# Patient Record
Sex: Female | Born: 1995 | Hispanic: Yes | Marital: Single | Smoking: Never smoker
Health system: Southern US, Community
[De-identification: ages and names within clinical notes are randomized; demographics above are authoritative.]

## PROBLEM LIST (undated history)

## (undated) ENCOUNTER — Inpatient Hospital Stay (HOSPITAL_COMMUNITY): Payer: Self-pay

## (undated) DIAGNOSIS — Z8489 Family history of other specified conditions: Secondary | ICD-10-CM

## (undated) DIAGNOSIS — E039 Hypothyroidism, unspecified: Secondary | ICD-10-CM

## (undated) HISTORY — DX: Hypothyroidism, unspecified: E03.9

## (undated) HISTORY — PX: NO PAST SURGERIES: SHX2092

---

## 2015-11-25 ENCOUNTER — Other Ambulatory Visit: Payer: Self-pay

## 2015-11-25 DIAGNOSIS — Z3401 Encounter for supervision of normal first pregnancy, first trimester: Secondary | ICD-10-CM

## 2015-11-25 LAB — HIV ANTIBODY (ROUTINE TESTING W REFLEX): HIV: NONREACTIVE

## 2015-11-26 LAB — OBSTETRIC PANEL
Antibody Screen: NEGATIVE
BASOS PCT: 0 %
Basophils Absolute: 0 cells/uL (ref 0–200)
EOS PCT: 0 %
Eosinophils Absolute: 0 cells/uL — ABNORMAL LOW (ref 15–500)
HEMATOCRIT: 37.8 % (ref 35.0–45.0)
HEP B S AG: NEGATIVE
Hemoglobin: 13.2 g/dL (ref 11.7–15.5)
LYMPHS ABS: 1728 {cells}/uL (ref 850–3900)
Lymphocytes Relative: 24 %
MCH: 30.8 pg (ref 27.0–33.0)
MCHC: 34.9 g/dL (ref 32.0–36.0)
MCV: 88.3 fL (ref 80.0–100.0)
MPV: 10.2 fL (ref 7.5–12.5)
Monocytes Absolute: 432 cells/uL (ref 200–950)
Monocytes Relative: 6 %
NEUTROS ABS: 5040 {cells}/uL (ref 1500–7800)
NEUTROS PCT: 70 %
Platelets: 244 10*3/uL (ref 140–400)
RBC: 4.28 MIL/uL (ref 3.80–5.10)
RDW: 13.8 % (ref 11.0–15.0)
RH TYPE: POSITIVE
Rubella: <0.9 Index (ref <0.90)
WBC: 7.2 10*3/uL (ref 3.8–10.8)

## 2015-11-26 LAB — CULTURE, OB URINE
Colony Count: NO GROWTH
ORGANISM ID, BACTERIA: NO GROWTH

## 2015-11-26 LAB — SICKLE CELL SCREEN: Sickle Cell Screen: NEGATIVE

## 2015-12-02 ENCOUNTER — Encounter: Payer: Self-pay | Admitting: Internal Medicine

## 2015-12-02 ENCOUNTER — Ambulatory Visit (INDEPENDENT_AMBULATORY_CARE_PROVIDER_SITE_OTHER): Payer: Self-pay | Admitting: Internal Medicine

## 2015-12-02 VITALS — BP 80/50 | HR 101 | Temp 98.9°F | Ht 64.75 in | Wt 131.0 lb

## 2015-12-02 DIAGNOSIS — R112 Nausea with vomiting, unspecified: Secondary | ICD-10-CM

## 2015-12-02 DIAGNOSIS — N898 Other specified noninflammatory disorders of vagina: Secondary | ICD-10-CM

## 2015-12-02 DIAGNOSIS — O0000 Abdominal pregnancy without intrauterine pregnancy: Secondary | ICD-10-CM

## 2015-12-02 LAB — POCT WET PREP (WET MOUNT): Clue Cells Wet Prep Whiff POC: NEGATIVE

## 2015-12-02 LAB — POCT URINALYSIS DIPSTICK
Blood, UA: NEGATIVE
Glucose, UA: NEGATIVE
Ketones, UA: >=160
NITRITE UA: NEGATIVE
PH UA: 5.5
Spec Grav, UA: >=1.03
Urobilinogen, UA: 1

## 2015-12-02 LAB — POCT UA - MICROSCOPIC ONLY

## 2015-12-02 LAB — OB RESULTS CONSOLE GC/CHLAMYDIA: GC PROBE AMP, GENITAL: NEGATIVE

## 2015-12-02 NOTE — Progress Notes (Signed)
[name] is a 20 y.o. yo G2P0010 at 4946w6d who presents for her initial prenatal visit. Pregnancy is not planned She reports nausea. Has vomited about 3x this month. Patient would like medication for this.  She  is taking PNV. See flow sheet for details.  PMH, POBH, FH, meds, allergies and Social Hx reviewed.  Prenatal Exam: Gen: Well nourished, well developed.  No distress.  Vitals noted. HEENT: Normocephalic, atraumatic.  Neck supple without cervical lymphadenopathy, thyromegaly or thyroid nodules.  Fair dentition. CV: RRR no murmur, gallops or rubs Lungs: CTAB.  Normal respiratory effort without wheezes or rales. Abd: soft, NTND. +BS.  Uterus not appreciated above pelvis. GU: Normal external female genitalia without lesions.  Normal vaginal, well rugated without lesions. Green/white vaginal discharge.  Bimanual exam: No adnexal mass, patient with cervical motion tenderness on exam.  Uterus size not determined Ext: No clubbing, cyanosis or edema. Psych: Normal grooming and dress.  Not depressed or anxious appearing.  Normal thought content and process without flight of ideas or looseness of associations.  Assessment & Plan: 1) 20 y.o. yo G1P0 at G2P0010 at 1146w6d doing well.  Current pregnancy issues include patient with vaginal discharge and dyspareunia. Dating is not reliable. Prenatal labs reviewed, notable G/C was not collected. Will collect at this visit. Genetic screening offered: - patient would like genetic screening  Early glucola is not indicated.  PHQ-9 and Pregnancy Medical Home forms completed and reviewed. PHQ-9 with a score of 12, discussed patient talking to behavioral medicine. Patient she might be interested in talking in the future but not today.  Nausea, prescribed pyridoxine  Yeast infection noted from lab result, prescribed miconazole cream   Bleeding and pain precautions reviewed. Importance of prenatal vitamins reviewed.  Follow up in 4 weeks.

## 2015-12-02 NOTE — Patient Instructions (Addendum)
I will call you regarding results as discussed. Please follow up in 4 weeks for next OB visit. You can take pyridoxine every 8 hours for nausea.

## 2015-12-03 ENCOUNTER — Telehealth: Payer: Self-pay | Admitting: Internal Medicine

## 2015-12-03 ENCOUNTER — Encounter: Payer: Self-pay | Admitting: Internal Medicine

## 2015-12-03 DIAGNOSIS — R112 Nausea with vomiting, unspecified: Secondary | ICD-10-CM

## 2015-12-03 HISTORY — DX: Nausea with vomiting, unspecified: R11.2

## 2015-12-03 LAB — GC/CHLAMYDIA PROBE AMP (~~LOC~~) NOT AT ARMC
CHLAMYDIA, DNA PROBE: NEGATIVE
NEISSERIA GONORRHEA: NEGATIVE

## 2015-12-03 LAB — CULTURE, OB URINE
Colony Count: NO GROWTH
Organism ID, Bacteria: NO GROWTH

## 2015-12-03 MED ORDER — VITAMIN B-6 25 MG PO TABS: 25.0000 mg | ORAL_TABLET | Freq: Three times a day (TID) | ORAL | Status: DC | PRN

## 2015-12-03 MED ORDER — VITAMIN B-6 25 MG PO TABS: 25.0000 mg | ORAL_TABLET | Freq: Every day | ORAL | Status: DC

## 2015-12-03 MED ORDER — MICONAZOLE NITRATE 4 % VA CREA: TOPICAL_CREAM | VAGINAL | Status: DC

## 2015-12-04 ENCOUNTER — Telehealth: Payer: Self-pay | Admitting: *Deleted

## 2015-12-04 NOTE — Telephone Encounter (Signed)
-----   Message from Kehinde T Eniola, MD sent at 12/04/2015  1:32 PM EDT ----- Please help schedule nurse visit for patient for BP check and to assess her vomiting status. Thank you. 

## 2015-12-04 NOTE — Telephone Encounter (Signed)
[facility]'t [facility] MICONAZOLE NITRATE VAGINAL 4 % CREAM. They only have 2%.  Order given to Kathlene NovemberMike, Pharmacist to change.  Clovis PuMartin, Tamika L, RN

## 2015-12-04 NOTE — Telephone Encounter (Signed)
Left message for pt to call me back to schedule a nurse visit. Page, cma.

## 2015-12-05 ENCOUNTER — Telehealth: Payer: Self-pay | Admitting: *Deleted

## 2015-12-05 NOTE — Telephone Encounter (Signed)
Called patient to set up blood pressure check, no answer as well.

## 2015-12-05 NOTE — Telephone Encounter (Signed)
[place] interpreter Marylu LundJanet [phone].  Patient is aware and scheduled for 12-06-15 and reconfirmed ultrasound for 12-09-15. Nevae Pinnix,CMA

## 2015-12-05 NOTE — Telephone Encounter (Signed)
-----   Message from Doreene ElandKehinde T Eniola, MD sent at 12/04/2015  1:32 PM EDT ----- Please help schedule nurse visit for patient for BP check and to assess her vomiting status. Thank you.

## 2015-12-06 ENCOUNTER — Encounter: Payer: Self-pay | Admitting: *Deleted

## 2015-12-06 ENCOUNTER — Ambulatory Visit (INDEPENDENT_AMBULATORY_CARE_PROVIDER_SITE_OTHER): Payer: Self-pay | Admitting: *Deleted

## 2015-12-06 VITALS — BP 90/52 | HR 96

## 2015-12-06 DIAGNOSIS — R031 Nonspecific low blood-pressure reading: Secondary | ICD-10-CM

## 2015-12-06 DIAGNOSIS — Z136 Encounter for screening for cardiovascular disorders: Secondary | ICD-10-CM

## 2015-12-06 DIAGNOSIS — Z0130 Encounter for examination of blood pressure without abnormal findings: Secondary | ICD-10-CM

## 2015-12-06 NOTE — Progress Notes (Signed)
   Patient in nurse clinic for blood pressure check.  Patient is about 14 weeks pregnancy with her first child.  Patient stated she is having some dizziness and nausea.  Patient denies any vomiting.  Patient also denies any chest pain, abdominal/pelvic cramping or vaginal bleeding.  Precept with Dr. Jennette KettleNeal; patient to drink about 1 gallon of water a day for the next 3 days and return on Monday 12/09/15 for blood pressure check with nurse.  Patient also advised to go to MAU if dizziness worsen, vomiting, abdominal/pelvic pain or vaginal bleeding.  Work note given to be absent for Sunday and Monday, return to work on Tuesday.  Patient stated understanding.  Appt 12/09/15 for BP at 4 PM.  Clovis PuMartin, Tai Skelly L, RN

## 2015-12-09 ENCOUNTER — Ambulatory Visit (INDEPENDENT_AMBULATORY_CARE_PROVIDER_SITE_OTHER): Payer: Self-pay | Admitting: *Deleted

## 2015-12-09 VITALS — BP 102/56 | HR 109 | Temp 99.0°F | Wt 136.4 lb

## 2015-12-09 DIAGNOSIS — R031 Nonspecific low blood-pressure reading: Secondary | ICD-10-CM

## 2015-12-09 DIAGNOSIS — Z0130 Encounter for examination of blood pressure without abnormal findings: Secondary | ICD-10-CM

## 2015-12-09 DIAGNOSIS — Z136 Encounter for screening for cardiovascular disorders: Secondary | ICD-10-CM

## 2015-12-09 NOTE — Progress Notes (Signed)
   Patient in nurse clinic today for repeat blood pressure check.  Last blood pressure was completed on 12/06/15 90/52 manually and heart rate 96; BP on 12/02/15 80/50, heart rate 101.  Patient is still having dizziness, denies n/v, abd/pelvic pain or bleeding.  Precept with Dr. Leveda AnnaHensel and Dr. Jennette KettleNeal; patient should have follow up visit with a provider tomorrow to discuss BP and HR.  Patient advised to go to MAU if dizziness worsen or she develops severe abd/pelvic pain or vaginal bleeding.  Appointment scheduled with Dr. Richarda BladeAdamo tomorrow at 11:30 12/10/15.  Clovis PuMartin, Taitum Menton L, RN   Today's Vitals   12/09/15 1549  BP: 102/56  Pulse: 109  Temp: 99 F (37.2 C)  TempSrc: Oral  Weight: 136 lb 6.4 oz (61.871 kg)  SpO2: 98%  PainSc: 0-No pain

## 2015-12-10 ENCOUNTER — Ambulatory Visit (INDEPENDENT_AMBULATORY_CARE_PROVIDER_SITE_OTHER): Payer: Self-pay | Admitting: Family Medicine

## 2015-12-10 ENCOUNTER — Encounter: Payer: Self-pay | Admitting: Family Medicine

## 2015-12-10 VITALS — BP 104/62 | HR 82 | Temp 98.4°F | Wt 135.0 lb

## 2015-12-10 DIAGNOSIS — E039 Hypothyroidism, unspecified: Secondary | ICD-10-CM | POA: Insufficient documentation

## 2015-12-10 DIAGNOSIS — R42 Dizziness and giddiness: Secondary | ICD-10-CM

## 2015-12-10 HISTORY — DX: Dizziness and giddiness: R42

## 2015-12-10 LAB — BASIC METABOLIC PANEL WITH GFR
BUN: 7 mg/dL (ref 7–25)
CHLORIDE: 104 mmol/L (ref 98–110)
CO2: 22 mmol/L (ref 20–31)
CREATININE: 0.46 mg/dL — AB (ref 0.50–1.10)
Calcium: 8.8 mg/dL (ref 8.6–10.2)
GFR, Est African American: >89 mL/min (ref >=60)
GFR, Est Non African American: >89 mL/min (ref >=60)
GLUCOSE: 72 mg/dL (ref 65–99)
Potassium: 3.7 mmol/L (ref 3.5–5.3)
SODIUM: 137 mmol/L (ref 135–146)

## 2015-12-10 LAB — TSH: TSH: 1.6 mIU/L

## 2015-12-10 NOTE — Patient Instructions (Signed)
Nuseas matinales (Morning Sickness) Se denominan nuseas matinales a las ganas de vomitar (nuseas) durante el embarazo. Comienza a sentir nuseas y devuelve (vomita). Siente nuseas por la maana, pero sigue sintindolas durante todo el da. Algunas mujeres experimentan nuseas intensas y no pueden detener los vmitos (hiperemesis gravdica). CUIDADOS EN EL HOGAR  Solo tome los medicamentos que le haya indicado su mdico.  Tome las multivitaminas segn las indicaciones del mdico. Tomar multivitaminas antes de quedar embarazada puede detener o disminuir el malestar de las nuseas matinales.  Coma un trozo de tostada seca o galletas sin sal antes de levantarse de la cama por la maana.  Coma 5 o 6 comidas pequeas por da.  Consuma alimentos blandos y secos como arroz y patatas asadas.  No beba lquidos con las comidas. Tome lquidos entre las comidas.  No coma alimentos fritos, grasos o muy condimentados.  Pdale a alguna persona que cocine para usted si el olor de las comidas le da nuseas o ganas de vomitar.  Si tiene ganas de vomitar despus de tomar las vitaminas prenatales, tmelas a la noche o con una colacin.  Consuma protenas cuando necesite una colacin (frutas secas, yogur, queso).  Coma gelatina sin azcar de postre.  Use un brazalete para los mareos (pulsera de acupresin).  Vaya a un especialista en colocar agujas finas en determinados puntos del cuerpo (acupuntura) para que usted se sienta mejor.  No fume.  Consiga un humidificador para mantener el aire de su casa libre de olores.  Trate de respirar aire fresco. SOLICITE AYUDA SI:  Necesita medicamentos para sentirse mejor.  Se siente mareada o sufre un desmayo.  Pierde peso. SOLICITE AYUDA DE INMEDIATO SI:   Tiene malestar estomacal y no puede dejar de vomitar.  Pierde el conocimiento (se desmaya). ASEGRESE DE QUE:  Comprende estas instrucciones.  Controlar su afeccin.  Recibir ayuda de  inmediato si no mejora o si empeora.   Esta informacin no tiene como fin reemplazar el consejo del mdico. Asegrese de hacerle al mdico cualquier pregunta que tenga.   Document Released: 06/08/2005 Document Revised: 06/29/2014 Elsevier Interactive Patient Education 2016 Elsevier Inc.  

## 2015-12-11 NOTE — Progress Notes (Signed)
Quick Note:  Please inform patient, all lab results normal. Thanks! ______

## 2015-12-13 NOTE — Progress Notes (Signed)
   Subjective:   Wendy Morrison is a 20 y.o. female with a history of hypothyroidism here for dizziness and tachycardia/hypotension f/u  Pt reports that her vomiting has resolved and the nausea and dizziness are improving. She drinks lots of water throughout the day. She denies palpitations or drug use. She endorses fatigue since becoming pregnant. She reports she was hypothyroid as a child and on synthroid for some time but then was told she no longer needed it when she had a normal TSH about a year ago.  Review of Systems:  Per HPI. All other systems reviewed and are negative.   PMH, PSH, Medications, Allergies, and FmHx reviewed and updated in EMR.  Social History: never smoker  Objective:  BP 104/62 mmHg  Pulse 82  Temp(Src) 98.4 F (36.9 C) (Oral)  Wt 135 lb (61.236 kg)  LMP 08/28/2015 (Approximate)  Gen:  20 y.o. female in NAD HEENT: NCAT, MMM, EOMI, PERRL, anicteric sclerae, goiter CV: RRR, no MRG Resp: Non-labored, CTAB, no wheezes noted Abd: Soft, NTND, BS present, no guarding or organomegaly Ext: WWP, no edema Neuro: Alert and oriented, speech normal      Chemistry      Component Value Date/Time   NA 137 12/10/2015 1209   K 3.7 12/10/2015 1209   CL 104 12/10/2015 1209   CO2 22 12/10/2015 1209   BUN 7 12/10/2015 1209   CREATININE 0.46* 12/10/2015 1209      Component Value Date/Time   CALCIUM 8.8 12/10/2015 1209      Lab Results  Component Value Date   WBC 7.2 11/25/2015   HGB 13.2 11/25/2015   HCT 37.8 11/25/2015   MCV 88.3 11/25/2015   PLT 244 11/25/2015   Lab Results  Component Value Date   TSH 1.60 12/10/2015   No results found for: HGBA1C Assessment & Plan:     Wendy Morrison is a 20 y.o. female here for tachy/hypotensive f/u  Hypothyroidism Hypothyroid as child, not on meds, last TSH 1 year ago was normal per her, goiter on exam - recheck TSH - monitor for increased hormone demands during pregnancy, will need TSH at least once  per trimester and at postpartum visit  Dizziness Occassional when standing quickly, better since vomiting resolved, likely some orthostasis with dehydration, was hypotensive and tachycardic at past few visits but this seems to finally be resolving and was likely from volume depretion and increase fluid requirements from pregnancy - continue pushing fluids - eat many small meals and snacks throughout the day - check BMP, TSH      Beverely LowElena Tilda Samudio, MD, MPH [facility] PGY-3 12/13/2015 1:55 PM

## 2015-12-13 NOTE — Assessment & Plan Note (Signed)
Hypothyroid as child, not on meds, last TSH 1 year ago was normal per her, goiter on exam - recheck TSH - monitor for increased hormone demands during pregnancy, will need TSH at least once per trimester and at postpartum visit

## 2015-12-13 NOTE — Assessment & Plan Note (Addendum)
Occassional when standing quickly, better since vomiting resolved, likely some orthostasis with dehydration, was hypotensive and tachycardic at past few visits but this seems to finally be resolving and was likely from volume depretion and increase fluid requirements from pregnancy - continue pushing fluids - eat many small meals and snacks throughout the day - check BMP, TSH

## 2015-12-31 ENCOUNTER — Ambulatory Visit (INDEPENDENT_AMBULATORY_CARE_PROVIDER_SITE_OTHER): Payer: Self-pay | Admitting: Internal Medicine

## 2015-12-31 ENCOUNTER — Encounter (INDEPENDENT_AMBULATORY_CARE_PROVIDER_SITE_OTHER): Payer: Self-pay | Admitting: Internal Medicine

## 2015-12-31 ENCOUNTER — Encounter: Payer: Self-pay | Admitting: Internal Medicine

## 2015-12-31 DIAGNOSIS — Z3482 Encounter for supervision of other normal pregnancy, second trimester: Secondary | ICD-10-CM

## 2015-12-31 NOTE — Progress Notes (Signed)
Wendy PasseySofia Morrison is a 20 y.o. G2P0010  at 6719 w5d per patient via ultrasound results, not yet faxed over.  She reports no issues with bleeding, contractions, no gush of fluid. No leg swelling. No dizziness or orthostatics.  Feels nausea at times still, denies any vomiting.   See flow sheet for details.  Uterine size 20 cm  FHR: 155   A/P: Pregnancy at approximately 5871w5d.  Doing well.   Pregnancy issues include hx of hypothyroidism, next TSH check at 28 weeks  Will follow up with health department, still have not received ultrasound results at this time Awaiting results of ultrasound, obtained at 5818w4d will not likely need any anatomy scan.  Patient wanted to the quad screen, however due to change in pregnancy age from ultrasound would not recommend. Patient agreeable to this  Preterm labor precautions reviewed. Next appointment to be made with OB clinic  Follow up 4 weeks

## 2015-12-31 NOTE — Patient Instructions (Signed)

## 2016-01-01 ENCOUNTER — Telehealth: Payer: Self-pay | Admitting: Family Medicine

## 2016-01-01 ENCOUNTER — Other Ambulatory Visit (INDEPENDENT_AMBULATORY_CARE_PROVIDER_SITE_OTHER): Payer: Self-pay

## 2016-01-01 DIAGNOSIS — Z3492 Encounter for supervision of normal pregnancy, unspecified, second trimester: Secondary | ICD-10-CM

## 2016-01-01 NOTE — Progress Notes (Signed)
Entered in Error

## 2016-01-01 NOTE — Telephone Encounter (Signed)
I called the health department to obtain the U/S report but no response. Wendy BasemanJessica Morrison is also working on this.  I called the patient for more clarification using pacific interpreter ID#: [id number]  Patient stated she had a miscarriage Jan 22nd and bleed through the end of February so she is uncertain her LMP. On April 27th, she found out she was pregnant. She had an U/S June 19th and on the report it says she was 16wks 2 days pregnant. She knows this for sure because she has the U/S report which she reviewed yesterday.  Based on that information today she is 19 wks plus 4 days.  As discussed with her, she can get Quad screen test up till 20 wks 6 days GA. She is advised to come in today for test. She agreed with plan.  Test ordered.  [name] will continue to try to obtain U/S report today. If available and result contrary to information provided by patient we can cancel the Quad screen.

## 2016-01-02 NOTE — Telephone Encounter (Signed)
OB U/S report obtained today. Date of procedure was 12/09/15 as stated by patient. GA age according to U/S on 12/09/15 was 16 weeks 3 days instead of 16 weeks 2 days as stated by the patient which is pretty close. Partial anatomy assessment was normal according to the scan report.  However follow up full anatomy scan recommended at [redacted] weeks GA. Dr. Cathlean CowerMikell to schedule follow up U/S  Fetus cephalic,Single intrauterine pregnancy, Female. EED: 05/22/16  U/S report signed and placed up front to be scanned on file.

## 2016-01-02 NOTE — Telephone Encounter (Signed)
Follow up call. She came in for her test. She also mentioned her MVI makes her constipated. I recommended use of Colace and advise follow up soon with PCP if no improvement. She agreed with plan.

## 2016-01-08 ENCOUNTER — Telehealth: Payer: Self-pay | Admitting: Family Medicine

## 2016-01-08 NOTE — Telephone Encounter (Signed)
Spoke with patient about her AFP result which was normal using an interpreter. All questions answered.

## 2016-01-15 ENCOUNTER — Telehealth: Payer: Self-pay | Admitting: Internal Medicine

## 2016-01-15 DIAGNOSIS — Z3492 Encounter for supervision of normal pregnancy, unspecified, second trimester: Secondary | ICD-10-CM | POA: Insufficient documentation

## 2016-01-15 DIAGNOSIS — Z3482 Encounter for supervision of other normal pregnancy, second trimester: Secondary | ICD-10-CM

## 2016-01-15 DIAGNOSIS — Z3480 Encounter for supervision of other normal pregnancy, unspecified trimester: Secondary | ICD-10-CM | POA: Insufficient documentation

## 2016-01-15 DIAGNOSIS — Z3493 Encounter for supervision of normal pregnancy, unspecified, third trimester: Secondary | ICD-10-CM | POA: Insufficient documentation

## 2016-01-15 NOTE — Telephone Encounter (Signed)
Received results of dating ultrasound. Patient will need an anatomy scan as this was not performed during ultrasound. Pease call patient to set up an anatomy scan at the health department within the next week.

## 2016-01-23 ENCOUNTER — Ambulatory Visit (INDEPENDENT_AMBULATORY_CARE_PROVIDER_SITE_OTHER): Payer: Self-pay | Admitting: Family Medicine

## 2016-01-23 VITALS — BP 97/53 | HR 100 | Temp 98.3°F | Wt 144.2 lb

## 2016-01-23 DIAGNOSIS — Z3482 Encounter for supervision of other normal pregnancy, second trimester: Secondary | ICD-10-CM

## 2016-01-23 MED ORDER — MICONAZOLE NITRATE 2 % VA CREA: 1.0000 | TOPICAL_CREAM | Freq: Every day | VAGINAL | 0 refills | Status: AC

## 2016-01-23 MED ORDER — DOCUSATE SODIUM 100 MG PO CAPS: 100.0000 mg | ORAL_CAPSULE | Freq: Two times a day (BID) | ORAL | 0 refills | Status: DC | PRN

## 2016-01-23 NOTE — Patient Instructions (Addendum)
Use miconazole one applicatorful before bed each night for 7 days  Apply lotion to your stomach to help with itching. If not improved within one week please let us know.  Scheduling anatomy ultrasound.  For constipation - switch to regular prenatal vitamin without iron. Can also take colace twice a day as needed. I sent this in for you.  At your next visit we will recheck your thyroid.  If you have any cramping/contractions, vaginal bleeding, fluid leaking, or are worried that baby is not moving normally, go immediately to [facility] to be evaluated.   Be well, Dr. Dolphus Jenny trimestre de Vanetta Mulders (Second Trimester of Pregnancy) El segundo trimestre va desde la semana13 hasta la 28, desde el cuarto hasta el sexto mes, y suele ser el momento en el que mejor se siente. Su organismo se ha adaptado a Charity fundraiser y comienza a Diplomatic Services operational officer. En general, las nuseas matutinas han disminuido o han desaparecido completamente, puede tener ms energa y un aumento de apetito. El segundo trimestre es tambin la poca en la que el feto se desarrolla rpidamente. Hacia el final del sexto mes, el feto mide aproximadamente 9pulgadas (23cm) y pesa alrededor de 1 libras (700g). Es probable que sienta que el beb se Teacher, English as a foreign language (da pataditas) entre las 18 y 20semanas del Psychiatrist. CAMBIOS EN EL ORGANISMO Su organismo atraviesa por muchos cambios durante el [place], y estos varan de Neomia Dear mujer a Educational psychologist.   Seguir [employer]. Notar que la parte baja del abdomen sobresale.  Podrn aparecer las primeras Albertson's caderas, el abdomen y las [place].  Es posible que tenga dolores de cabeza que pueden aliviarse con los medicamentos que el mdico le permita tomar.  Tal vez tenga necesidad de orinar con ms frecuencia porque el feto est ejerciendo presin Ambulance person.  Debido al Vanetta Mulders podr sentir Anthoney Harada estomacal con frecuencia.  Puede estar estreida, ya que  ciertas hormonas enlentecen los movimientos de los msculos que [employer] desechos a travs de los intestinos.  Pueden aparecer hemorroides o abultarse e hincharse las venas (venas varicosas).  Puede tener dolor de espalda que se debe al Citigroup de peso y a que las hormonas del Management consultant las articulaciones entre los huesos de la pelvis, y Public librarian consecuencia de la modificacin del peso y los msculos que mantienen el equilibrio.  Las [employer]ra [employer] seguirn creciendo y Development worker, community.  Las Veterinary surgeon y estar sensibles al cepillado y al hilo dental.  Pueden aparecer zonas oscuras o manchas (cloasma, mscara del Psychiatrist) en el rostro que probablemente se atenuar despus del nacimiento del beb.  Es posible que se forme una lnea oscura desde el ombligo hasta la zona del pubis (linea nigra) que probablemente se atenuar despus del nacimiento del beb.  Tal vez haya cambios en el cabello que pueden incluir su engrosamiento, crecimiento rpido y cambios en la textura. Adems, a algunas mujeres se les cae el cabello durante o despus del embarazo, o tienen el cabello seco o fino. Lo ms probable es que el cabello se le normalice despus del nacimiento del beb. QU DEBE ESPERAR EN LAS CONSULTAS PRENATALES Durante una visita prenatal de rutina:  La pesarn para asegurarse de que usted y el feto estn creciendo normalmente.  Le tomarn la presin arterial.  Le medirn el abdomen para controlar el desarrollo del beb.  Se escucharn los latidos cardacos fetales.  Se evaluarn los resultados de los estudios solicitados en visitas anteriores. El mdico  puede preguntarle lo siguiente:  Cmo se siente.  Si siente los movimientos del beb.  Si ha tenido sntomas anormales, como prdida de lquido, [place], dolores de cabeza intensos o clicos abdominales.  Si est consumiendo algn producto que contenga tabaco, como cigarrillos, tabaco de Theatre manager y Administrator, Civil Service.  Si tiene  [employer]. Otros estudios que podrn realizarse durante el segundo trimestre incluyen lo siguiente:  Anlisis de sangre para detectar lo siguiente:  Concentraciones de hierro bajas (anemia).  Diabetes gestacional (entre la semana 24 y la 28).  Anticuerpos Rh.  Anlisis de orina para detectar infecciones, diabetes o protenas en la orina.  Una ecografa para confirmar que el beb crece y se desarrolla correctamente.  Una amniocentesis para diagnosticar posibles problemas genticos.  Estudios del feto para descartar espina bfida y sndrome de Down.  Prueba del VIH (virus de inmunodeficiencia humana). Los exmenes prenatales de rutina incluyen la prueba de deteccin del VIH, a menos que decida no Futures trader. INSTRUCCIONES PARA EL CUIDADO EN EL HOGAR   Evite fumar, consumir hierbas, beber alcohol y tomar frmacos que no le hayan recetado. Estas sustancias qumicas afectan la formacin y el desarrollo del beb.  No consuma ningn producto que contenga tabaco, lo que incluye cigarrillos, tabaco de Theatre manager y Administrator, Civil Service. Si necesita ayuda para dejar de fumar, consulte al American Express. Puede recibir asesoramiento y otro tipo de recursos para dejar de fumar.  Siga las indicaciones del mdico en relacin con el uso de medicamentos. Durante el embarazo, hay medicamentos que son seguros de tomar y otros que no.  Haga ejercicio solamente como se lo haya indicado el mdico. Sentir clicos uterinos es un buen signo para Restaurant manager, fast food actividad fsica.  Contine comiendo alimentos sanos con regularidad.  Use un sostn que le brinde buen soporte si le [employer]ria Group.  No se d baos de inmersin en agua caliente, baos turcos ni saunas.  Use el cinturn de seguridad en todo momento mientras conduce.  No coma carne cruda ni queso sin cocinar; evite el contacto con las bandejas sanitarias de los gatos y la tierra que estos animales usan. Estos elementos contienen grmenes que pueden  causar defectos congnitos en el beb.  Tome las vitaminas prenatales.  Tome entre 1500 y  de calcio diariamente comenzando en la semana20 del embarazo [place].  Si est estreida, pruebe un laxante suave (si el mdico lo autoriza). Consuma ms alimentos ricos en fibra, como vegetales y frutas frescos y Radiation protection practitioner. Beba gran cantidad de lquido para mantener la orina de tono claro o color amarillo plido.  Dese baos de asiento con agua tibia para Engineer, materials o las molestias causadas por las hemorroides. Use una crema para las hemorroides si el mdico la autoriza.  Si tiene venas varicosas, use medias de descanso. Eleve los pies durante , 3 o 4veces por da. Limite el consumo de sal en su dieta.  No levante objetos pesados, use zapatos de tacones bajos y [address].  Descanse con las piernas elevadas si tiene calambres o dolor de cintura.  Visite a su dentista si an no lo ha Occupational hygienist. Use un cepillo de dientes blando para higienizarse los dientes y psese el hilo dental con suavidad.  Puede seguir [employer], a menos que el mdico le indique lo contrario.  Concurra a todas las visitas prenatales segn las indicaciones de su mdico. SOLICITE ATENCIN MDICA SI:   [place].  Siente clicos leves, presin en  la pelvis o dolor persistente en el abdomen.  Tiene nuseas, vmitos o diarrea persistentes.  Brett Fairy secrecin vaginal con mal olor.  Siente dolor al [employer]oco[employer]. SOLICITE ATENCIN MDICA DE INMEDIATO SI:   Tiene fiebre.  Tiene una prdida de lquido por la vagina.  Tiene sangrado o pequeas prdidas vaginales.  Siente dolor intenso o clicos en el abdomen.  Sube o baja de peso rpidamente.  Tiene dificultad para respirar y siente dolor de pecho.  Sbitamente se le hinchan mucho el rostro, las [place], los tobillos, los pies o las piernas.  No ha sentido los movimientos  del beb durante Georgianne Fick.  Siente un dolor de cabeza intenso que no se alivia con medicamentos.  Su visin se modifica.   Esta informacin no tiene Theme park manager el consejo del mdico. Asegrese de hacerle al mdico cualquier pregunta que tenga.   Document Released: 03/18/2005 Document Revised: 06/29/2014 Elsevier Interactive Patient Education [employer]

## 2016-01-23 NOTE — Progress Notes (Signed)
[facility] Visit  Primary Prenatal Care Provider: Dr. Darlyne Russian Wendy Morrison is a 20 y.o. G2P0010 at [redacted]w[redacted]d who presents to [facility] faculty clinic for routine prenatal visit. Spanish interpreter utilized during today's visit. Denies cramping/ctx, fluid leaking, vaginal bleeding, or decreased fetal movement.    She has several concerns today: - constipation - thinks iron in her PNV is making her constipated. 4 days without bowel movement  - feels "swollen" in vaginal area when she wipes. Also has discharge and some itching. Burns on skin when she has intercourse. No new sexual partners. No blisters or history of herpes. Discharge is yellow, sometimes light green. Had neg gc/chlamydia testing this pregnancy. Was diagnosed with yeast on wet prep & given rx for miconazole but has not used it as she was afraid it would hurt the baby. - abdominal skin itching for 3 weeks. No rashes. No itching elsewhere. No abdominal pain. Thinks it's from her skin stretching.  OB history reviewed with patient. Has one prior pregnancy, ended in medical elective abortion in January of this year.    Also reviewed prenatal course thus far: pap not indicated (age 29), rest of labs unremarkable, rh positive. Had dating ultrasound done on 6/19 dating pregnancy at [redacted]w[redacted]d- assigned new due date of 05/22/16 due to discordance with LMP.  Contraception: nexplanon Feeding: breastfeeding Circumcision: unsure (though likely having a girl based on prior u/s) Pediatrician: unsure - offered pediatric care here at [facility]   Pregnancy issues include: - incorrect dating - updated dating criteria in epic to reflect new EDC of 05/22/16. - elevated PHQ-9 earlier in pregnancy (score 12) but declined intervention. Patient states mood has recently been "explosive" but denies SI/HI, no feelings of depression. Sleeps well. Overall thinks things are going fine. Continue to monitor at subsequent  visits. - history of hypothyroidism as a child - normal TSH at [redacted]w[redacted]d, plan to recheck with 28 week labs and then once later in third trimester. - constipation - had normal hgb, will switch to PNV without iron, also rx colace - abdominal itching - likely due to skin stretching over enlarging uterus. No rashes appreciated on abdomen today. Recommend lotion to keep skin hydrated. If not improved within 1 week patient to inform us. If continues would check bile acids to ensure not cholestasis (though description not typical for cholestasis at this time). - yeast infection - vulvar skin mildly irritated, consistent with candidiasis as previously dx'd on wet prep. Recommend miconazole. New rx sent in. - anatomy scan - behind on scheduling anatomy ultrasound due to new EDC. This was scheduled during today's visit for within the next week.  Reviewed preterm labor and fetal movement precautions.  Follow up 4 weeks for next prenatal visit with Dr. Cathlean Cower.  Levert Feinstein, MD [facility]

## 2016-02-20 ENCOUNTER — Ambulatory Visit (INDEPENDENT_AMBULATORY_CARE_PROVIDER_SITE_OTHER): Payer: Self-pay | Admitting: Internal Medicine

## 2016-02-20 VITALS — BP 101/59 | HR 98 | Temp 98.3°F | Wt 153.0 lb

## 2016-02-20 DIAGNOSIS — M545 Low back pain, unspecified: Secondary | ICD-10-CM

## 2016-02-20 DIAGNOSIS — Z3482 Encounter for supervision of other normal pregnancy, second trimester: Secondary | ICD-10-CM

## 2016-02-20 HISTORY — DX: Low back pain, unspecified: M54.50

## 2016-02-20 LAB — POCT URINALYSIS DIPSTICK
Bilirubin, UA: NEGATIVE
Glucose, UA: NEGATIVE
Ketones, UA: NEGATIVE
NITRITE UA: NEGATIVE
PROTEIN UA: NEGATIVE
RBC UA: NEGATIVE
SPEC GRAV UA: 1.015
UROBILINOGEN UA: 1
pH, UA: 7.5

## 2016-02-20 LAB — POCT UA - MICROSCOPIC ONLY

## 2016-02-20 NOTE — Addendum Note (Signed)
Addended by: Jennette BillBUSICK, Arel Tippen L on: 02/20/2016 04:52 PM   Modules accepted: Orders

## 2016-02-20 NOTE — Patient Instructions (Signed)
Please come back for lab visit. Follow up in 4 weeks. Please go to the health department in the next two weeks to get TDAP     Dolor de espalda durante el embarazo  (Back Pain in Pregnancy)  El dolor de espalda es habitual durante el [place]bar[place]. Ocurre en aproximadamente la mitad de todos los [place]embarazos. Es importante para usted y su beb que permanezca activa durante el [place]barazo.Si siente que Chief Technology Officerel dolor de espalda es lo que no le permite mantenerse activa o dormir bien, Scientist, clinical (histocompatibility and immunogenetics)debe consultar a su mdico. La causa del dolor de espalda puede deberse a varios factores relacionados con los cambios durante el [place] Houseembarazo.Afortunadamente, excepto que haya tenido problemas de espalda antes del [place]embarazo, es probable que el dolor mejore despus del [place] Midd[place]. El dolor lumbar por lo general ocurre entre el quinto y sptimo mes del Psychiatristembarazo. Sin embargo, puede ocurrir Foot Lockeren los dos primeros meses. Otros factores que aumentan el riesgo son:   Problemas previos en la espalda.  Lesiones en la espalda.  Tener gemelos o embarazos mltiples.  Tos persistente.  El estrs.  Movimientos repetitivos relacionados con Kathie Dikeel trabajo.  Enfermedad muscular o de la columna vertebral en la espalda.  Antecedentes familiares de problemas de espalda, rotura (hernia) de discos u osteoporosis.  Depresin, ansiedad y crisis de [place]. CAUSAS   En las embarazadas, el cuerpo produce una hormona llamada relaxina. Esta hormona hace que los ligamentos que conectan la zona lumbar y los huesos del pubis sean ms flexibles. Esta flexibilidad permite que el beb nazca con ms facilidad. Cuando los ligamentos estn relajados, los msculos tienen que trabajar ms para apoyar la espalda. El dolor en la espalda puede deberse al cansancio muscular. El dolor tambin puede tener su causa en la irritacin de los tejidos de a espalda que se irritan ya que estn recibiendo menos apoyo.  A medida que el beb crece, ejerce presin United Stationerssobre los nervios y los vasos  sanguneos de la pelvis. Esto causa dolor de espalda.  A medida que el beb crece y [address] el peso durante el [place]embarazo, el tero presiona los msculos del estmago hacia adelante y [place] su centro de gravedad. Esto hace que los msculos de la espalda deban trabajar ms para mantener una buena Tumalopostura. SNTOMAS  Dolor lumbar durante el embarazo Generalmente se produce en la zona o por arriba de la cintura en el centro de la espalda. Puede haber dolor y entumecimiento que se irradia hacia la pierna o el pie. Es similar al dolor de espalda baja experimentada por las mujeres no embarazadas. Por lo general, aumenta al UnitedHealthpermanecer de pie o sentada por largos perodos de [place] Laketiempo, o con levantamientos repetitivos Tambin puede haber sensibilidad en los msculos en la zona superior de la espalda .  Dolor plvico posterior Environmental consultantdurante el embarazo Dolor en la parte posterior de la pelvis es ms frecuente que el dolor lumbar en el embarazo. Se trata de un dolor profundo que se siente a un lado en la cintura, o a travs del cxis (sacro), o en ambos lugares. Puede sentir dolor en uno o ambos lados Este dolor tambin puede sentirse en las nalgas y el dorso de los muslos Tambin puede haber dolor pbico y en la ingle. El dolor no se mejora rpidamente con el reposo, y [place] Republictambin puede haber rigidez matutina. Muchas actividades pueden causarlo. Un buen estado fsico antes y [address]urante el [address] puede o no prevenir este problema. Las contracciones del parto suelen aparecer cada 1 a 2 minutos, tienen Air Products and Chemicalsuna  duracin de aproximadamente 1 minuto, e implica una sensacin de empujar o presin en la pelvis. Sin embargo, si usted est a trmino con Firefighter, Chief Technology Officer constante en la zona lumbar puede indicar el comienzo de un parto prematuro, y usted debe ser consciente de ello.  DIAGNSTICO  No se deben tomar radiografas de la [employer] [place] [employer] las primeras 12 a 14 semanas del embarazo y durante el resto del Psychiatrist, slo cuando sea  absolutamente necesario. La resonancia magntica no emite radiacin y es un estudio seguro durante el Psychiatrist. Pero tambin se deben hacer solamente cuando sea absolutamente necesario.  INSTRUCCIONES PARA EL CUIDADO EN EL HOGAR   Realice actividad fsica segn las indicaciones del mdico. El ejercicio es la manera ms eficaz para prevenir o tratar Chief Technology Officer de espalda. Si tiene un problema en la espalda, es especialmente importante evitar los deportes que requieran de movimientos corporales rpidos. La natacin y las caminatas son las mejores [address].  No permanezca sentada o de pie en el mismo lugar durante largos perodos.  No use tacos altos.  Sintese en la silla con una buena postura. Use una almohada en su espalda baja si es necesario. Asegrese de que su cabeza descansa sobre sus hombros y no est colgando hacia delante.  Trate de dormir de lado, de preferencia el lado izquierdo, con una o [employer] piernas. Si est dolorida despus de una noche de descanso, la cama puede ser OGE Energy.Trate de colocar una tabla entre el colchn y el somier.  Prstele atencin a su cuerpo cuando se levante.Si siente dolor,pida ayuda o trate de doblar las rodillas ms para [employer] de las piernas en lugar de los msculos de la espalda. Pngase en cuclillas al levantar algo del suelo. No se doble.  Consuma una dieta saludable. Trate de aumentar de peso dentro de las recomendaciones de su mdico.  Utilice compresas de calor o fro de 3 a 4 veces al da durante 15 minutos para Primary school teacher.  Solo tome medicamentos que se pueden comprar sin receta o recetados para Chief Technology Officer, Dentist o fiebre, como le indica el mdico. Dolor de espaldas repentino (agudo).  Haga reposo en cama slo en caso de los episodios ms extremos y agudos de Engineer, mining. El reposo prolongado en cama de ms de 48 horas agravar su trastorno.  El hielo es muy efectivo en los problemas agudos.  Ponga  el hielo en una bolsa plstica.  Colquese una toalla entre la piel y la bolsa de hielo.  Deje el hielo durante 10 a 20 minutos cada 2 horas o segn lo nesecite, mientras se encuentre despierta.  Las compresas de calor durante 30 minutos antes de las actividades tambin puede ayudar. Dolor crnico en la espalda. Consulte a su mdico si el dolor es continuo. El mdico podr ayudarla o derivarla para que realice los ejercicios y trabajos de fortalecimiento apropiados. Con un buen entrenamiento fsico, podr evitar la mayor parte de los [place]. En algunos casos, la causa es un problema ms grave. Debe ser controlada inmediatamente si aparecen nuevos problemas. El mdico tambin podr recomendar:   Una faja de maternidad.  Un arns elstico.  Un cors para la espalda.  Un masajista o acupuntura. SOLICITE ATENCIN MDICA SI:   No puede Careers information officer de sus actividades diarias, an tomando los medicamentos para Psychologist, occupational.  Beverlee Nims ser derivada a un fisioterapeuta o quiroprxico.  Beverlee Nims intentar con acupuntura. SOLICITE ATENCIN MDICA  DE INMEDIATO SI:   Siente entumecimiento, hormigueo, debilidad o problemas con el uso de los brazos o las piernas.  Siente un dolor de espalda muy intenso que no se alivia con medicamentos.  Tiene modificaciones repentinas en el control de la vejiga o el intestino.  Aumenta el dolor en otras partes del cuerpo.  Siente que le falta el aire, se marea o sufre un [place].  Tiene nuseas, vmitos o sudoracin.  Siente un dolor en la espalda similar al del [place].  Cuando aparece Starwood Hotels, rompe la bolsa de aguas o tiene un sangrado vaginal.  El dolor o el adormecimiento se extienden hacia la pierna.  El dolor aparece despus de una cada.  Siente dolor de un solo lado. Podra tener clculos renales.  Observa sangre en la orina. Podra tener una infeccin en la vejiga o clculos renales.  Siente dolor y  aparecen ronchas. Podra tener culebrilla. El dolor de espalda es bastante frecuente durante el embarazo pero no debe aceptarse slo como parte del [place]. Siempre debe tratarse lo ms rpidamente posible. Har que su embarazo sea lo ms placentero posible.    Esta informacin no tiene Theme park manager el consejo del mdico. Asegrese de hacerle al mdico cualquier pregunta que tenga.   Document Released: 02/18/2011 Document Revised: 08/31/2011 Elsevier Interactive Patient Education 2016 Arvin[employer]. [place] trimestre de Psychiatrist (Second Trimester of Pregnancy) El segundo trimestre va desde la semana13 hasta la 28, desde el cuarto hasta el sexto mes, y suele ser el momento en el que mejor se siente. Su organismo se ha adaptado a Charity fundraiser y comienza a Diplomatic Services operational officer. En general, las nuseas matutinas han disminuido o han desaparecido completamente, puede tener ms energa y un aumento de apetito. El segundo trimestre es tambin la poca en la que el feto se desarrolla rpidamente. Hacia el final del sexto mes, el feto mide aproximadamente 9pulgadas (23cm) y pesa alrededor de 1 libras (700g). Es probable que sienta que el beb se Teacher, English as a foreign language (da pataditas) entre las 18 y 20semanas del Psychiatrist. CAMBIOS EN EL ORGANISMO Su organismo atraviesa por muchos cambios durante el [place], y estos varan de Neomia Dear mujer a Educational psychologist.   Seguir American Standard Companies. Notar que la parte baja del abdomen sobresale.  Podrn aparecer las primeras Albertson's caderas, el abdomen y las [place].  Es posible que tenga dolores de cabeza que pueden aliviarse con los medicamentos que el mdico le permita tomar.  Tal vez tenga necesidad de orinar con ms frecuencia porque el feto est ejerciendo presin Ambulance person.  Debido al Vanetta Mulders podr sentir Anthoney Harada estomacal con frecuencia.  Puede estar estreida, ya que ciertas hormonas enlentecen los movimientos de los msculos que [employer] desechos a  travs de los intestinos.  Pueden aparecer hemorroides o abultarse e hincharse las venas (venas varicosas).  Puede tener dolor de espalda que se debe al Citigroup de peso y a que las hormonas del Management consultant las articulaciones entre los huesos de la pelvis, y Public librarian consecuencia de la modificacin del peso y los msculos que mantienen el equilibrio.  Las ConAgra Foods seguirn creciendo y Development worker, community.  Las Veterinary surgeon y estar sensibles al cepillado y al hilo dental.  Pueden aparecer zonas oscuras o manchas (cloasma, mscara del Psychiatrist) en el rostro que probablemente se atenuar despus del nacimiento del beb.  Es posible que se forme una lnea oscura desde el ombligo hasta la zona del pubis (linea nigra) que probablemente se Firefighter  despus del nacimiento del beb.  Tal vez haya cambios en el cabello que pueden incluir su engrosamiento, crecimiento rpido y cambios en la textura. Adems, a algunas mujeres se les cae el cabello durante o despus del embarazo, o tienen el cabello seco o fino. Lo ms probable es que el cabello se le normalice despus del nacimiento del beb. QU DEBE ESPERAR EN LAS CONSULTAS PRENATALES Durante una visita prenatal de rutina:  La pesarn para asegurarse de que usted y el feto estn creciendo normalmente.  Le tomarn la presin arterial.  Le medirn el abdomen para controlar el desarrollo del beb.  Se escucharn los latidos cardacos fetales.  Se evaluarn los resultados de los estudios solicitados en visitas anteriores. El mdico puede preguntarle lo siguiente:  Cmo se siente.  Si siente los movimientos del beb.  Si ha tenido sntomas anormales, como prdida de lquido, [place], dolores de cabeza intensos o clicos abdominales.  Si est consumiendo algn producto que contenga tabaco, como cigarrillos, tabaco de Theatre manager y Administrator, Civil Service.  Si tiene Colgate-Palmolive. Otros estudios que podrn realizarse durante el segundo trimestre  incluyen lo siguiente:  Anlisis de sangre para detectar lo siguiente:  Concentraciones de hierro bajas (anemia).  Diabetes gestacional (entre la semana 24 y la 28).  Anticuerpos Rh.  Anlisis de orina para detectar infecciones, diabetes o protenas en la orina.  Una ecografa para confirmar que el beb crece y se desarrolla correctamente.  Una amniocentesis para diagnosticar posibles problemas genticos.  Estudios del feto para descartar espina bfida y sndrome de Down.  Prueba del VIH (virus de inmunodeficiencia humana). Los exmenes prenatales de rutina incluyen la prueba de deteccin del VIH, a menos que decida no Futures trader. INSTRUCCIONES PARA EL CUIDADO EN EL HOGAR   Evite fumar, consumir hierbas, beber alcohol y tomar frmacos que no le hayan recetado. Estas sustancias qumicas afectan la formacin y el desarrollo del beb.  No consuma ningn producto que contenga tabaco, lo que incluye cigarrillos, tabaco de Theatre manager y Administrator, Civil Service. Si necesita ayuda para dejar de fumar, consulte al American Express. Puede recibir asesoramiento y otro tipo de recursos para dejar de fumar.  Siga las indicaciones del mdico en relacin con el uso de medicamentos. Durante el embarazo, hay medicamentos que son seguros de tomar y otros que no.  Haga ejercicio solamente como se lo haya indicado el mdico. Sentir clicos uterinos es un buen signo para Restaurant manager, fast food actividad fsica.  Contine comiendo alimentos sanos con regularidad.  Use un sostn que le brinde buen soporte si le Altria Group.  No se d baos de inmersin en agua caliente, baos turcos ni saunas.  Use el cinturn de seguridad en todo momento mientras conduce.  No coma carne cruda ni queso sin cocinar; evite el contacto con las bandejas sanitarias de los gatos y la tierra que estos animales usan. Estos elementos contienen grmenes que pueden causar defectos congnitos en el beb.  Tome las vitaminas prenatales.  Tome  entre 1500 y 2000mg  de calcio diariamente comenzando en la semana20 del embarazo [place].  Si est estreida, pruebe un laxante suave (si el mdico lo autoriza). Consuma ms alimentos ricos en fibra, como vegetales y frutas frescos y Radiation protection practitioner. Beba gran cantidad de lquido para mantener la orina de tono claro o color amarillo plido.  Dese baos de asiento con agua tibia para Engineer, materials o las molestias causadas por las hemorroides. Use una crema para las hemorroides si el mdico la autoriza.  Si  tiene venas varicosas, use medias de descanso. Eleve los pies durante , 3 o 4veces por da. Limite el consumo de sal en su dieta.  No levante objetos pesados, use zapatos de tacones bajos y [address].  Descanse con las piernas elevadas si tiene calambres o dolor de cintura.  Visite a su dentista si an no lo ha Occupational hygienist. Use un cepillo de dientes blando para higienizarse los dientes y psese el hilo dental con suavidad.  Puede seguir Calpine Corporation, a menos que el mdico le indique lo contrario.  Concurra a todas las visitas prenatales segn las indicaciones de su mdico. SOLICITE ATENCIN MDICA SI:   [place].  Siente clicos leves, presin en la pelvis o dolor persistente en el abdomen.  Tiene nuseas, vmitos o diarrea persistentes.  Brett Fairy secrecin vaginal con mal olor.  Siente dolor al ConocoPhillips. SOLICITE ATENCIN MDICA DE INMEDIATO SI:   Tiene fiebre.  Tiene una prdida de lquido por la vagina.  Tiene sangrado o pequeas prdidas vaginales.  Siente dolor intenso o clicos en el abdomen.  Sube o baja de peso rpidamente.  Tiene dificultad para respirar y siente dolor de pecho.  Sbitamente se le hinchan mucho el rostro, las [place], los tobillos, los pies o las piernas.  No ha sentido los movimientos del beb durante Georgianne Fick.  Siente un dolor de cabeza intenso que no se alivia  con medicamentos.  Su visin se modifica.   Esta informacin no tiene Theme park manager el consejo del mdico. Asegrese de hacerle al mdico cualquier pregunta que tenga.   Document Released: 03/18/2005 Document Revised: 06/29/2014 Elsevier Interactive Patient Education [employer]

## 2016-02-20 NOTE — Progress Notes (Signed)
Wendy Morrison is a 20 y.o. G2P0010 at 3275w6d for routine follow up.   She reports having taken miconazole for yeast infection, However reports she has had a return of her yeast infection.  Symptoms similar to previous yeast infection including vaginal itching an irritation. Patient has been using a new soap may possibly contribute to yeast infection.   Denies any abdominal itching, denies nausea or dizziness Lower back pain: states she has been having lower back pain off and on for about two weeks. Sometimes the pain will radiate to her front of her stomach at times.  She says pain is worsens when patient is on her feet most the day and active. Sometimes pain associated with tightening of the stomach. Tightening of stomach intermittent. No pain with urination, no freq or urgency of urination. No nausea/vomiting. No fever or chills.      See flow sheet for details.  Abdomen: gravid, no suprapubic tenderness, no CVA tenderness  Back:  tenderness at sacroiliac joints  Uterine Size 27 cm  FHT: 138 bpm    A/P: Pregnancy at 6175w6d.  Doing well.   Pregnancy issues include   Back pain likely musculoskeletal in nature. Possibly some component of Braxton-Hicks contractions  - will get UA to r/o urinary tract infection  -  Recommend tylenol and heating pad for back pain  -  Discussed preterm labor- the idea of regular contractions are a concern for patient to go to emergency department   Yeast infection, states similar to previous and does not want a pelvic exam today  - Recommend miconazole as patient has previously used  - Stopping new soap that she has been using- recommend Dove   28 week labs - Future labs ordered with recommendation for 1 week follow up  - CBC; Future - RPR; Future - HIV antibody; Future - TSH; Future - POCT glucose (manual entry); - glucose tolerance test.  - TDAP prescription, with recs to go to health department for this immunization in 2 weeks  Childbirth and  education classes were not offered. However discussed opportunities through adopt-A- mom  Preterm labor precautions reviewed. Follow up 4 weeks.

## 2016-02-28 ENCOUNTER — Other Ambulatory Visit (INDEPENDENT_AMBULATORY_CARE_PROVIDER_SITE_OTHER): Payer: Self-pay

## 2016-02-28 DIAGNOSIS — Z3482 Encounter for supervision of other normal pregnancy, second trimester: Secondary | ICD-10-CM

## 2016-02-28 LAB — CBC
HEMATOCRIT: 33.5 % — AB (ref 35.0–45.0)
HEMOGLOBIN: 11.6 g/dL — AB (ref 11.7–15.5)
MCH: 31 pg (ref 27.0–33.0)
MCHC: 34.6 g/dL (ref 32.0–36.0)
MCV: 89.6 fL (ref 80.0–100.0)
MPV: 10.3 fL (ref 7.5–12.5)
Platelets: 234 10*3/uL (ref 140–400)
RBC: 3.74 MIL/uL — AB (ref 3.80–5.10)
RDW: 12.4 % (ref 11.0–15.0)
WBC: 9 10*3/uL (ref 3.8–10.8)

## 2016-02-28 LAB — GLUCOSE, CAPILLARY: Glucose-Capillary: 78 mg/dL (ref 65–99)

## 2016-02-29 LAB — TSH: TSH: 1.2 m[IU]/L

## 2016-02-29 LAB — RPR

## 2016-02-29 LAB — HIV ANTIBODY (ROUTINE TESTING W REFLEX): HIV: NONREACTIVE

## 2016-03-23 ENCOUNTER — Ambulatory Visit (INDEPENDENT_AMBULATORY_CARE_PROVIDER_SITE_OTHER): Payer: Self-pay | Admitting: Internal Medicine

## 2016-03-23 VITALS — BP 101/60 | HR 93 | Temp 98.2°F | Wt 160.8 lb

## 2016-03-23 DIAGNOSIS — Z3483 Encounter for supervision of other normal pregnancy, third trimester: Secondary | ICD-10-CM

## 2016-03-23 NOTE — Progress Notes (Signed)
Wendy PasseySofia Morrison is a 20 y.o. G2P0010 at 6358w3d for routine follow up.  She reports no leakage of fluid, no vaginal bleeding, no contractions. Fetal movement   See flow sheet for details.  A/P: Pregnancy at 2858w3d.  Doing well.   Pregnancy issues include patient states that 2 weeks ago she had some lower back pain and tightening of her belly. She states this last a couple of hours.   FHT 140  Uterine size 31 cm   Infant feeding choice: Breast  Contraception choice Nexplanon  Infant circumcision: not applicable   Tdapwas given at the health department  GBS/GC/CZ testing was not performed today.  Preterm labor precautions reviewed.- Discussed that if she has these contractions mentioned above consistently for 1-2 hours to go to MAU to be evaluated. Remind patient to continue to hydrate herself, indicated that this can often cause onset of preterm labor.  Safe sleep discussed. Kick counts reviewed. Follow up 2 weeks with OB clinic

## 2016-03-23 NOTE — Patient Instructions (Signed)
Evaluacin de los movimientos fetales  (Fetal Movement Counts) Nombre del paciente: __________________________________________________ Fecha de parto estimada: ____________________ La evaluacin de los movimientos fetales es muy recomendable en los embarazos de alto riesgo, pero tambin es una buena idea que lo hagan todas las embarazadas. El mdico le indicar que comience a contarlos a las 28 semanas de embarazo. Los movimientos fetales suelen aumentar:   Despus de una comida completa.  Despus de la actividad fsica.  Despus de comer o beber algo dulce o fro.  En reposo. Preste atencin cuando sienta que el beb est ms activo. Esto le ayudar a notar un patrn de ciclos de vigilia y sueo de su beb y cules son los factores que contribuyen a un aumento de los movimientos fetales. Es importante llevar a cabo un recuento de movimientos fetales, al mismo tiempo cada da, cuando el beb normalmente est ms activo.  CMO CONTAR LOS MOVIMIENTOS FETALES 1. Busque un lugar tranquilo y cmodo para sentarse o recostarse sobre el lado izquierdo. Al recostarse sobre su lado izquierdo, le proporciona una mejor circulacin de sangre y oxgeno al beb. 2. Anote el da y la hora en una hoja de papel o en un diario. 3. Comience contando las pataditas, revoloteos, chasquidos, vueltas o pinchazos en un perodo de 2 horas. Debe sentir al menos 10 movimientos en 2 horas. 4. Si no siente 10 movimientos en 2 horas, espere 2  3 horas y cuente de nuevo. Busque cambios en el patrn o si no cuenta lo suficiente en 2 horas. SOLICITE ATENCIN MDICA SI:   Siente menos de 10 pataditas en 2 horas, en dos intentos.  No hay movimientos durante una hora.  El patrn se modifica o le lleva ms tiempo cada da contar las 10 pataditas.  Siente que el beb no se mueve como lo hace habitualmente. Fecha: ____________ Movimientos: ____________ Hora de inicio: ____________ Hora de finalizacin: ____________  Fecha:  ____________ Movimientos: ____________ Hora de inicio: ____________ Hora de finalizacin: ____________  Fecha: ____________ Movimientos: ____________ Hora de inicio: ____________ Hora de finalizacin: ____________  Fecha: ____________ Movimientos: ____________ Hora de inicio: ____________ Hora de finalizacin: ____________  Fecha: ____________ Movimientos: ____________ Hora de inicio: ____________ Hora de finalizacin: ____________  Fecha: ____________ Movimientos: ____________ Hora de inicio: ____________ Hora de finalizacin: ____________  Fecha: ____________ Movimientos: ____________ Hora de inicio: ____________ Hora de finalizacin: ____________  Fecha: ____________ Movimientos: ____________ Hora de inicio: ____________ Hora de finalizacin: ____________  Fecha: ____________ Movimientos: ____________ Hora de inicio: ____________ Hora de finalizacin: ____________  Fecha: ____________ Movimientos: ____________ Hora de inicio: ____________ Hora de finalizacin: ____________  Fecha: ____________ Movimientos: ____________ Hora de inicio: ____________ Hora de finalizacin: ____________  Fecha: ____________ Movimientos: ____________ Hora de inicio: ____________ Hora de finalizacin: ____________  Fecha: ____________ Movimientos: ____________ Hora de inicio: ____________ Hora de finalizacin: ____________  Fecha: ____________ Movimientos: ____________ Hora de inicio: ____________ Hora de finalizacin: ____________  Fecha: ____________ Movimientos: ____________ Hora de inicio: ____________ Hora de finalizacin: ____________  Fecha: ____________ Movimientos: ____________ Hora de inicio: ____________ Hora de finalizacin: ____________  Fecha: ____________ Movimientos: ____________ Hora de inicio: ____________ Hora de finalizacin: ____________  Fecha: ____________ Movimientos: ____________ Hora de inicio: ____________ Hora de finalizacin: ____________  Fecha: ____________ Movimientos: ____________ Hora  de inicio: ____________ Hora de finalizacin: ____________  Fecha: ____________ Movimientos: ____________ Hora de inicio: ____________ Hora de finalizacin: ____________  Fecha: ____________ Movimientos: ____________ Hora de inicio: ____________ Hora de finalizacin: ____________  Fecha: ____________ Movimientos: ____________ Hora de inicio: ____________ Hora de   finalizacin: ____________  Franco Nones: ____________ Movimientos: ____________ Stevan Born inicio: ____________ Stevan Born finalizacin: ____________  Franco Nones: ____________ Movimientos: ____________ Stevan Born inicio: ____________ Stevan Born finalizacin: ____________  Franco Nones: ____________ Movimientos: ____________ Stevan Born inicio: ____________ Stevan Born finalizacin: ____________  Franco Nones: ____________ Movimientos: ____________ Stevan Born inicio: ____________ Stevan Born finalizacin: ____________  Franco Nones: ____________ Movimientos: ____________ Stevan Born inicio: ____________ Mammie Russian de finalizacin: ____________  Franco Nones: ____________ Movimientos: ____________ Mammie Russian de inicio: ____________ Mammie Russian de finalizacin: ____________  Franco Nones: ____________ Movimientos: ____________ Mammie Russian de inicio: ____________ Mammie Russian de finalizacin: ____________  Franco Nones: ____________ Movimientos: ____________ Mammie Russian de inicio: ____________ Mammie Russian de finalizacin: ____________  Franco Nones: ____________ Movimientos: ____________ Mammie Russian de inicio: ____________ Mammie Russian de finalizacin: ____________  Franco Nones: ____________ Movimientos: ____________ Mammie Russian de inicio: ____________ Mammie Russian de finalizacin: ____________  Franco Nones: ____________ Movimientos: ____________ Mammie Russian de inicio: ____________ Mammie Russian de finalizacin: ____________  Franco Nones: ____________ Movimientos: ____________ Mammie Russian de inicio: ____________ Mammie Russian de finalizacin: ____________  Franco Nones: ____________ Movimientos: ____________ Mammie Russian de inicio: ____________ Mammie Russian de finalizacin: ____________  Franco Nones: ____________ Movimientos: ____________ Mammie Russian de inicio: ____________ Mammie Russian de finalizacin:  ____________  Franco Nones: ____________ Movimientos: ____________ Mammie Russian de inicio: ____________ Mammie Russian de finalizacin: ____________  Franco Nones: ____________ Movimientos: ____________ Mammie Russian de inicio: ____________ Mammie Russian de finalizacin: ____________  Franco Nones: ____________ Movimientos: ____________ Mammie Russian de inicio: ____________ Mammie Russian de finalizacin: ____________  Franco Nones: ____________ Movimientos: ____________ Mammie Russian de inicio: ____________ Mammie Russian de finalizacin: ____________  Franco Nones: ____________ Movimientos: ____________ Mammie Russian de inicio: ____________ Mammie Russian de finalizacin: ____________  Franco Nones: ____________ Movimientos: ____________ Mammie Russian de inicio: ____________ Mammie Russian de finalizacin: ____________  Franco Nones: ____________ Movimientos: ____________ Mammie Russian de inicio: ____________ Mammie Russian de finalizacin: ____________  Franco Nones: ____________ Movimientos: ____________ Mammie Russian de inicio: ____________ Mammie Russian de finalizacin: ____________  Franco Nones: ____________ Movimientos: ____________ Mammie Russian de inicio: ____________ Mammie Russian de finalizacin: ____________  Franco Nones: ____________ Movimientos: ____________ Mammie Russian de inicio: ____________ Mammie Russian de finalizacin: ____________  Franco Nones: ____________ Movimientos: ____________ Mammie Russian de inicio: ____________ Mammie Russian de finalizacin: ____________  Franco Nones: ____________ Movimientos: ____________ Mammie Russian de inicio: ____________ Mammie Russian de finalizacin: ____________  Franco Nones: ____________ Movimientos: ____________ Mammie Russian de inicio: ____________ Mammie Russian de finalizacin: ____________  Franco Nones: ____________ Movimientos: ____________ Mammie Russian de inicio: ____________ Mammie Russian de finalizacin: ____________  Franco Nones: ____________ Movimientos: ____________ Mammie Russian de inicio: ____________ Mammie Russian de finalizacin: ____________  Franco Nones: ____________ Movimientos: ____________ Mammie Russian de inicio: ____________ Mammie Russian de finalizacin: ____________  Franco Nones: ____________ Movimientos: ____________ Mammie Russian de inicio: ____________ Mammie Russian de finalizacin: ____________  Franco Nones: ____________  Movimientos: ____________ Mammie Russian de inicio: ____________ Mammie Russian de finalizacin: ____________  Franco Nones: ____________ Movimientos: ____________ Mammie Russian de inicio: ____________ Mammie Russian de finalizacin: ____________  Franco Nones: ____________ Movimientos: ____________ Mammie Russian de inicio: ____________ Mammie Russian de finalizacin: ____________    Esta informacin no tiene como fin reemplazar el consejo del mdico. Asegrese de hacerle al mdico cualquier pregunta que tenga.   Document Released: 09/15/2007 Document Revised: 05/25/2012 Elsevier Interactive Patient Education 2016 [employer]. Systems analyst trimestre de Psychiatrist (Third Trimester of Pregnancy) El tercer trimestre comprende desde la semana29 [employer] semana42, es decir, desde el mes7 hasta el [address] El tercer trimestre es un perodo en el que el feto crece rpidamente. Hacia el final del noveno mes, el feto mide alrededor de 20pulgadas (45cm) de largo y pesa entre 6 y 10 libras (2,700 y 72,500kg).  CAMBIOS EN EL ORGANISMO Su organismo atraviesa por muchos cambios durante el [place], y estos varan de Neomia Dear mujer a Educational psychologist.   Seguir [employer]. Es de esperar que aumente entre 25 y 35libras (11 y 16kg) hacia el final del Psychiatrist.  Podrn aparecer las primeras estras en  las caderas, el abdomen y Lenalas mamas.  Puede tener necesidad de Geographical information systems officerorinar con ms frecuencia porque el feto baja hacia la pelvis y ejerce presin sobre la vejiga.  Debido al Vanetta Muldersembarazo podr sentir Anthoney Haradaacidez estomacal con frecuencia.  Puede estar estreida, ya que ciertas hormonas enlentecen los movimientos de los msculos que New York Life Insuranceempujan los desechos a travs de los intestinos.  Pueden aparecer hemorroides o abultarse e hincharse las venas (venas varicosas).  Puede sentir dolor plvico debido al Con-wayaumento de peso y a que las hormonas del Management consultantembarazo relajan las articulaciones entre los huesos de la pelvis. El dolor de espalda puede ser consecuencia de la sobrecarga de los msculos que soportan la  Dodgepostura.  Tal vez haya cambios en el cabello que pueden incluir su engrosamiento, crecimiento rpido y cambios en la textura. Adems, a algunas mujeres se les cae el cabello durante o despus del embarazo, o tienen el cabello seco o fino. Lo ms probable es que el cabello se le normalice despus del nacimiento del beb.  Las ConAgra Foodsmamas seguirn creciendo y Development worker, communityle dolern. A veces, puede haber una secrecin amarilla de las mamas llamada calostro.  El ombligo puede salir hacia afuera.  Puede sentir que le falta el aire debido a que se expande el tero.  Puede notar que el feto "baja" o lo siente ms bajo, en el abdomen.  Puede tener una prdida de secrecin mucosa con sangre. Esto suele ocurrir en el trmino de unos [address] das a una semana antes de que comience el Gordontrabajo de [place] Mountainparto.  El cuello del tero se vuelve delgado y blando (se borra) cerca de la fecha de [place]. QU DEBE ESPERAR EN LOS EXMENES PRENATALES  Le harn exmenes prenatales cada 2semanas hasta la semana36. A partir de ese momento le harn exmenes semanales. Durante una visita prenatal de rutina:  La pesarn para asegurarse de que usted y el feto estn creciendo normalmente.  Le tomarn la presin arterial.  Le medirn el abdomen para controlar el desarrollo del beb.  Se escucharn los latidos cardacos fetales.  Se evaluarn los resultados de los estudios solicitados en visitas anteriores.  Le revisarn el cuello del tero cuando est prxima la fecha de parto para controlar si este se ha borrado. Alrededor de la semana36, el mdico le revisar el cuello del tero. Al mismo tiempo, realizar un anlisis de las secreciones del tejido vaginal. Este examen es para determinar si hay un tipo de bacteria, estreptococo Grupo B. El mdico le explicar esto con ms detalle. El mdico puede preguntarle lo siguiente:  Cmo le gustara que fuera el [place]parto.  Cmo se siente.  Si siente los movimientos del beb.  Si ha tenido sntomas  anormales, como prdida de lquido, Cal[place] Beachsangrado, dolores de cabeza intensos o clicos abdominales.  Si est consumiendo algn producto que contenga tabaco, como cigarrillos, tabaco de Theatre managermascar y Administrator, Civil Servicecigarrillos electrnicos.  Si tiene Colgate-Palmolivealguna pregunta. Otros exmenes o estudios de deteccin que pueden realizarse durante el tercer trimestre incluyen lo siguiente:  Anlisis de sangre para controlar los niveles de hierro (anemia).  Controles fetales para determinar su salud, nivel de [place] Vincent and the Grenadinesactividad y Designer, jewellerycrecimiento. Si tiene Jerseyalguna enfermedad o hay problemas durante el embarazo, le harn estudios.  Prueba del VIH (virus de inmunodeficiencia humana). Si corre Chiropodistun riesgo alto, pueden realizarle una prueba de deteccin del VIH durante el tercer trimestre del embarazo. FALSO TRABAJO DE PARTO Es posible que sienta contracciones leves e irregulares que finalmente desaparecen. Se llaman contracciones de 1000 Pine StreetBraxton Hicks o falso trabajo de Stebbinsparto.  Las [employer] pueden durar horas, [address] o incluso semanas, antes de que el verdadero trabajo de parto se inicie. Si las contracciones ocurren a intervalos regulares, se intensifican o se hacen dolorosas, lo mejor es que la revise el mdico.  SIGNOS DE TRABAJO DE PARTO   Clicos de tipo menstrual.  Contracciones cada o menos.  Contracciones que comienzan en la parte superior del tero y se extienden hacia abajo, a la zona inferior del abdomen y la espalda.  Sensacin de mayor presin en la pelvis o dolor de espalda.  Una secrecin de mucosidad acuosa o con sangre que sale de la vagina. Si tiene alguno de estos signos antes de la semana37 del Psychiatrist, llame a su mdico de inmediato. Debe concurrir al hospital para que la controlen inmediatamente. INSTRUCCIONES PARA EL CUIDADO EN EL HOGAR   Evite fumar, consumir hierbas, beber alcohol y tomar frmacos que no le hayan recetado. Estas sustancias qumicas afectan la formacin y el desarrollo del beb.  No consuma  ningn producto que contenga tabaco, lo que incluye cigarrillos, tabaco de Theatre manager y Administrator, Civil Service. Si necesita ayuda para dejar de fumar, consulte al [employer]. Puede recibir asesoramiento y otro tipo de recursos para dejar de fumar.  Siga las indicaciones del mdico en relacin con el uso de medicamentos. Durante el embarazo, hay medicamentos que son seguros de tomar y otros que no.  Haga ejercicio solamente como se lo haya indicado el mdico. Sentir clicos uterinos es un buen signo para Restaurant manager, fast food actividad fsica.  Contine comiendo alimentos sanos con regularidad.  Use un sostn que le brinde buen soporte si le [employer].  No se d baos de inmersin en agua caliente, baos turcos ni saunas.  Use el cinturn de seguridad en todo momento mientras conduce.  No coma carne cruda ni queso sin cocinar; evite el contacto con las bandejas sanitarias de los gatos y la tierra que estos animales usan. Estos elementos contienen grmenes que pueden causar defectos congnitos en el beb.  Tome las vitaminas prenatales.  Tome entre 1500 y 2000mg  de calcio diariamente comenzando en la semana20 del embarazo [place].  Si est estreida, pruebe un laxante suave (si el mdico lo autoriza). Consuma ms alimentos ricos en fibra, como vegetales y frutas frescos y Radiation protection practitioner. Beba gran cantidad de lquido para mantener la orina de tono claro o color amarillo plido.  Dese baos de asiento con agua tibia para Engineer, materials o las molestias causadas por las hemorroides. Use una crema para las hemorroides si el mdico la autoriza.  Si tiene venas varicosas, use medias de descanso. Eleve los pies durante , 3 o 4veces por da. Limite el consumo de sal en su dieta.  Evite levantar objetos pesados, use zapatos de tacones bajos y [place].  Descanse con las piernas elevadas si tiene calambres o dolor de cintura.  Visite a su dentista si no lo ha  Occupational hygienist. Use un cepillo de dientes blando para higienizarse los dientes y psese el hilo dental con suavidad.  Puede seguir [employer], a menos que el mdico le indique lo contrario.  No haga viajes largos excepto que sea absolutamente necesario y solo con la autorizacin del mdico.  Tome clases prenatales para Financial trader, Education administrator y hacer preguntas sobre el [place] de parto y [place].  Haga un ensayo de la partida al hospital.  Prepare el bolso que llevar al hospital.  Prepare la habitacin del beb.  Concurra a todas las visitas prenatales segn las indicaciones de su mdico. SOLICITE ATENCIN MDICA SI:  No est segura de que est en trabajo de parto o de que ha roto la bolsa de las aguas.  Tiene mareos.  Siente clicos leves, presin en la pelvis o dolor persistente en el abdomen.  Tiene nuseas, vmitos o diarrea persistentes.  Brett Fairy secrecin vaginal con mal olor.  Siente dolor al [employer]. SOLICITE ATENCIN MDICA DE INMEDIATO SI:   Tiene fiebre.  Tiene una prdida de lquido por la vagina.  Tiene sangrado o pequeas prdidas vaginales.  Siente dolor intenso o clicos en el abdomen.  Sube o baja de peso rpidamente.  Tiene dificultad para respirar y siente dolor de pecho.  Sbitamente se le hinchan mucho el rostro, las [place], los tobillos, los pies o las piernas.  No ha sentido los movimientos del beb durante Georgianne Fick.  Siente un dolor de cabeza intenso que no se alivia con medicamentos.  Su visin se modifica.   Esta informacin no tiene Theme park manager el consejo del mdico. Asegrese de hacerle al mdico cualquier pregunta que tenga.   Document Released: 03/18/2005 Document Revised: 06/29/2014 Elsevier Interactive Patient Education [employer]

## 2016-04-02 ENCOUNTER — Ambulatory Visit (INDEPENDENT_AMBULATORY_CARE_PROVIDER_SITE_OTHER): Payer: Self-pay | Admitting: Family Medicine

## 2016-04-02 VITALS — BP 111/61 | HR 97 | Temp 97.9°F | Wt 161.0 lb

## 2016-04-02 DIAGNOSIS — Z2839 Other underimmunization status: Secondary | ICD-10-CM

## 2016-04-02 DIAGNOSIS — O09899 Supervision of other high risk pregnancies, unspecified trimester: Secondary | ICD-10-CM | POA: Insufficient documentation

## 2016-04-02 DIAGNOSIS — N898 Other specified noninflammatory disorders of vagina: Secondary | ICD-10-CM

## 2016-04-02 DIAGNOSIS — O9989 Other specified diseases and conditions complicating pregnancy, childbirth and the puerperium: Secondary | ICD-10-CM

## 2016-04-02 DIAGNOSIS — Z283 Underimmunization status: Secondary | ICD-10-CM

## 2016-04-02 DIAGNOSIS — O26893 Other specified pregnancy related conditions, third trimester: Secondary | ICD-10-CM

## 2016-04-02 LAB — POCT WET PREP (WET MOUNT)
Clue Cells Wet Prep Whiff POC: NEGATIVE
Trichomonas Wet Prep HPF POC: ABSENT
WBC, Wet Prep HPF POC: >20

## 2016-04-02 MED ORDER — MICONAZOLE NITRATE 200 MG VA SUPP: 200.0000 mg | Freq: Every day | VAGINAL | 0 refills | Status: DC

## 2016-04-02 MED ORDER — MICONAZOLE NITRATE 100 MG VA SUPP: 100.0000 mg | Freq: Every day | VAGINAL | 0 refills | Status: DC

## 2016-04-02 MED ORDER — MICONAZOLE NITRATE 100 MG VA SUPP: 100.0000 mg | Freq: Every day | VAGINAL | Status: AC

## 2016-04-02 NOTE — Patient Instructions (Addendum)
Follow up in 3 weeks Go to the Health department for a Flu shot [address] [place], [place] [address] [phone]  If you feel strong regular contractions, have vaginal bleeding like a period, loss of fluid like you broke your water go to [facility]. If you feel baby is moving less, sit in a quiet place and focus on baby. You want at least 10 movements in 2 hours. These movements can be subtle!   Tercer trimestre de Psychiatrist (Third Trimester of Pregnancy) El tercer trimestre comprende desde la semana29 hasta la semana42, es decir, desde el mes7 hasta el [address] En este trimestre, el feto crece muy rpido. Hacia el final del noveno mes, el feto mide alrededor de 20pulgadas (45cm) de largo y pesa entre 6y 10libras [phone]).  CUIDADOS EN EL HOGAR   No fume, no consuma hierbas ni beba alcohol. No tome frmacos que el mdico no haya autorizado.  No consuma ningn producto que contenga tabaco, lo que incluye cigarrillos, tabaco de Theatre manager o Administrator, Civil Service. Si necesita ayuda para dejar de fumar, consulte al [employer]. Puede recibir asesoramiento u otro tipo de apoyo para dejar de fumar.  Tome los medicamentos solamente como se lo haya indicado el mdico. Algunos medicamentos son seguros para tomar durante el Psychiatrist y otros no lo son.  Haga ejercicios solamente como se lo haya indicado el mdico. Interrumpa la actividad fsica si comienza a tener calambres.  Ingiera alimentos saludables de [place] regular.  Use un sostn que le brinde buen soporte si sus mamas estn sensibles.  No se d baos de inmersin en agua caliente, baos turcos ni saunas.  Colquese el cinturn de seguridad cuando conduzca.  No coma carne cruda ni queso sin cocinar; evite el contacto con las bandejas sanitarias de los gatos y la tierra que estos animales usan.  Tome las vitaminas prenatales.  Tome entre 1500 y 2000mg  de calcio diariamente comenzando en la semana20 del embarazo  [place].  Pruebe tomar un medicamento que la ayude a defecar (un laxante suave) si el mdico lo autoriza. Consuma ms fibra, que se encuentra en las frutas y verduras frescas y los cereales integrales. Beba suficiente lquido para mantener el pis (orina) claro o de color amarillo plido.  Dese baos de asiento con agua tibia para Engineer, materials o las molestias causadas por las hemorroides. Use una crema para las hemorroides si el mdico la autoriza.  Si se le hinchan las venas (venas varicosas), use medias de descanso. Levante (eleve) los pies durante , 3 o 4veces por Futures trader. Limite el consumo de sal en su dieta.  No levante objetos pesados, use zapatos de tacones bajos y sintese derecha.  Descanse con las piernas elevadas si tiene calambres o dolor de cintura.  Visite a su dentista si no lo ha Occupational hygienist. Use un cepillo de cerdas suaves para cepillarse los dientes. Psese el hilo dental con suavidad.  Puede seguir [employer], a menos que el mdico le indique lo contrario.  No haga viajes de larga distancia, excepto si es obligatorio y solamente con la aprobacin del mdico.  Tome clases prenatales.  Practique ir manejando al hospital.  Prepare el bolso que llevar al hospital.  Prepare la habitacin del beb.  Concurra a los controles mdicos. SOLICITE AYUDA SI:  No est segura de si est en trabajo de parto o si ha roto la bolsa de las aguas.  Tiene mareos.  Siente calambres leves o presin en  la parte inferior del abdomen.  Sufre un dolor persistente en el abdomen.  Tiene Programme researcher, broadcasting/film/videomalestar estomacal (nuseas), vmitos, o tiene deposiciones acuosas (diarrea).  Advierte un olor ftido que proviene de la vagina.  Siente dolor al ConocoPhillipsorinar. SOLICITE AYUDA DE INMEDIATO SI:   Tiene fiebre.  Tiene una prdida de lquido por la vagina.  Tiene sangrado o pequeas prdidas vaginales.  Siente dolor intenso o clicos en el  abdomen.  Sube o baja de peso rpidamente.  Tiene dificultades para recuperar el aliento y siente dolor en el pecho.  Sbitamente se le hinchan mucho el rostro, las Pollockmanos, los tobillos, los pies o las piernas.  No ha sentido los movimientos del beb durante Georgianne Fickuna hora.  Siente un dolor de cabeza intenso que no se alivia con medicamentos.  Su visin se modifica.   Esta informacin no tiene Theme park managercomo fin reemplazar el consejo del mdico. Asegrese de hacerle al mdico cualquier pregunta que tenga.   Document Released: 02/08/2013 Document Revised: 06/29/2014 Elsevier Interactive Patient Education 2016 [employer]  Southern CompanyEleccin del mtodo anticonceptivo (Contraception Choices) La anticoncepcin (control de la natalidad) es el uso de cualquier mtodo o dispositivo para Location managerevitar el embarazo. A continuacin se indican algunos de esos mtodos. MTODOS HORMONALES   El Implante contraconceptivo consiste en un tubo plstico delgado que contiene la hormona progesterona. No contiene estrgenos. El mdico inserta el tubo en la parte interna del brazo. El tubo puede Geneticist, molecularpermanecer en el lugar durante 3 aos. Despus de los 3 aos debe retirarse. El implante impide que los ovarios liberen vulos (ovulacin), espesa el moco cervical, lo que evita que los espermatozoides ingresen al tero y hace ms delgada la membrana que cubre el interior del tero.  Inyecciones de progesterona sola: las Insurance underwriteradministra el mdico cada 3 meses para Location managerevitar el embarazo. La progesterona sinttica impide que los ovarios liberen vulos. Tambin hacen que el moco cervical se espese y modifique el tejido de recubrimiento interno del tero. Esto hace ms difcil que los espermatozoides sobrevivan en el tero.  Las pldoras anticonceptivas contienen estrgenos y Education officer, museumprogesterona. Su funcin es ALLTEL Corporationevitar que los ovarios liberen vulos (ovulacin). Las hormonas de los anticonceptivos orales hacen que el moco cervical se haga ms espeso, lo que evita que el  esperma ingrese al tero. Las pldoras anticonceptivas son recetadas por el mdico.Tambin se utilizan para tratar los perodos menstruales abundantes.  Minipldora: este tipo de pldora anticonceptiva contiene slo hormona progesterona. Deben tomarse todos los [address]  [address] del mes y debe recetarlas el mdico.  El parche de control de natalidad: contiene hormonas similares a las que contienen las pldoras anticonceptivas. Deben cambiarse una vez por semana y se utilizan bajo prescripcin mdica.  Anillo vaginal: contiene hormonas similares a las que contienen las pldoras anticonceptivas. Se deja colocado durante tres semanas, se lo retira durante 1 semana y luego se coloca uno nuevo. La paciente debe sentirse cmoda al insertar y retirar el anillo de la vagina.Es necesaria la prescripcin mdica.  Anticonceptivos de emergencia: son mtodos para evitar un embarazo despus de Neomia Dearuna relacin sexual sin proteccin. Esta pldora puede tomarse inmediatamente despus de Child psychotherapisttener relaciones sexuales o hasta 5 [place]as de haber tenido sexo sin proteccin. Es ms efectiva si se toma poco tiempo despus de la relacin sexual. Los anticonceptivos de emergencia estn disponibles sin prescripcin mdica. Consltelo con su farmacutico. No use los anticonceptivos de emergencia como nico mtodo anticonceptivo. MTODOS DE Lenis NoonBARRERA   Condn masculino: es una vaina delgada (ltex o goma) que se coloca cubriendo al pene C.H. Robinson Worldwidedurante  el acto sexual. Deri Fuelling con espermicida para aumentar la efectividad.  Condn femenino. Es una funda delicada y blanda que se adapta holgadamente a la vagina antes de las Clinical research associate.  Diafragma: es una barrera de ltex redonda y suave que debe ser recomendado por un profesional. Se inserta en la vagina, junto con un gel espermicida. Debe insertarse antes de Management consultant. Debe dejar el diafragma colocado en la vagina durante 6 a 8 horas despus de la relacin sexual.  Capuchn  cervical: es una barrera de ltex o taza plstica redonda y [place] que cubre el cuello del tero y debe ser colocada por un mdico. Puede dejarlo colocado en la vagina hasta 48 horas despus de las Clinical research associate.  Esponja: es una pieza blanda y circular de espuma de poliuretano. Contiene un espermicida. Se inserta en la vagina despus de mojarla y antes de las [employer].  Espermicidas: son sustancias qumicas que matan o bloquean al esperma y no lo dejan ingresar al cuello del tero y al tero. Vienen en forma de cremas, geles, supositorios, espuma o comprimidos. No es necesario tener Emergency planning/management officer. Se insertan en la vagina con un aplicador antes de Management consultant. El proceso debe repetirse cada vez que tiene relaciones sexuales. ANTICONCEPTIVOS INTRAUTERINOS  Dispositivo intrauterino (DIU) es un dispositivo en forma de T que se coloca en el tero durante el perodo menstrual, para Location manager. Hay dos tipos:  DIU de cobre: este tipo de DIU est recubierto con un alambre de cobre y se inserta dentro del tero. El cobre hace que el tero y las trompas de Falopio produzcan un liquido que [employer] espermatozoides. Puede permanecer colocado durante 10 aos.  DIU con hormona: este tipo de DIU contiene la hormona progestina (progesterona sinttica). La hormona espesa el moco cervical y evita que los espermatozoides ingresen al tero y tambin afina la membrana que cubre el tero para evitar la implantacin del vulo fertilizado. La hormona debilita o destruye los espermatozoides que ingresan al tero. Puede Geneticist, molecular durante 3-5 aos, segn el tipo de DIU que se [place]. MTODOS ANTICONCEPTIVOS PERMANENTES  Ligadura de trompas en la mujer: se realiza sellando, atando u obstruyendo quirrgicamente las trompas de Falopio lo que impide que el vulo descienda hacia el tero.  Esterilizacin histeroscpica: Implica la colocacin de un pequeo espiral o la  insercin en cada trompa de Falopio. El mdico utiliza una tcnica llamada histeroscopa para Primary school teacher procedimiento. El dispositivo produce la formacin de tejido Designer, television/film set. Esto da como resultado una obstruccin permanente de las trompas de Falopio, de modo que la esperma no pueda fertilizar el vulo. Demora alrededor de 3 meses despus del procedimiento hasta que el conducto se obstruye. Tendr que usar otro mtodo anticonceptivo durante al menos 3 meses.  Esterilizacin masculina: se realiza ligando los conductos por los que pasan los espermatozoides (vasectoma).Esto impide que el esperma ingrese a la vagina durante el acto sexual. Luego del procedimiento, el hombre puede eyacular lquido (semen). MTODOS DE PLANIFICACIN NATURAL  Planificacin familiar natural: consiste en no Management consultant o usar un mtodo de barrera (condn, [place], capuchn cervical) en los [employer] la mujer podra quedar [place].  Mtodo de calendario: consiste en el seguimiento de la duracin de cada ciclo menstrual y la identificacin de los perodos frtiles.  Mtodo de ovulacin: Paramedic las relaciones sexuales durante la ovulacin.  Mtodo sintotrmico: Paramedic las relaciones sexuales en la poca en la que se est  ovulando, utilizando un termmetro y tendiendo en cuenta los sntomas de la ovulacin.  Mtodo postovulacin: Youth worker las relaciones sexuales para despus de haber ovulado. Independientemente del tipo o mtodo anticonceptivo que usted elija, es importante que use condones para protegerse contra las infecciones de transmisin sexual (ETS). Hable con su mdico con respecto a qu mtodo anticonceptivo es el ms apropiado para usted.   Esta informacin no tiene Theme park manager el consejo del mdico. Asegrese de hacerle al mdico cualquier pregunta que tenga.   Document Released: 06/08/2005 Document Revised: 02/08/2013 Elsevier Interactive Patient  Education [employer]

## 2016-04-02 NOTE — Progress Notes (Signed)
20 y/o G2P0010 at 32/6 ( V=69) Preg complicated by hypothyroidism (not on medication), rubella non immune status S: denies vaginal bleeding, loss of fluid, contractions and reports good FM. She does report vaginal itching which has been on going, denies found vaginal odor dysuria vaginal irritation O See tab  PMH- deneis Meds- PNV PSH- denies OB Hx-  G1- TAB G2- current Fam Hx- denies history of fetal birth defects, mental retardation Soc: denies etoh, tobacco use, drug use  Pelvic: Normal EGBUS, normal vaginal canal with moderate white thick diascharge, normal cervix   A/P UIP at 32/6 doing well Routine OB- continue PNV, s/p tdap at the HD needs flu shot. She has been counseled to go to the HD to obtain this. Will need GBS, GC/CT at next visit Vaginal itching- wet prep obtained, will treat for concern of symptomatic yeast infection, miconazole 100 mg qD x7 days Rubella non immune- MMR post partum Hypothyroidism- last TSH on 9/9 was 1.20. Continue TSH testing qtrimester. Repeat TSH in 2 weeks PP plans- girl, breast, considering Nexplanon, plans to get peds care at [facility]   Labor precautions reviewed  Alyssa A. Lincoln Brigham MD, [facility] Resident PGY-3 Pager [phone]

## 2016-04-08 ENCOUNTER — Encounter (HOSPITAL_COMMUNITY): Payer: Self-pay | Admitting: *Deleted

## 2016-04-08 ENCOUNTER — Telehealth: Payer: Self-pay | Admitting: Internal Medicine

## 2016-04-08 ENCOUNTER — Inpatient Hospital Stay (HOSPITAL_COMMUNITY)
Admission: AD | Admit: 2016-04-08 | Discharge: 2016-04-08 | Disposition: A | Discharge status: Home / Self Care | Payer: Self-pay | Source: Ambulatory Visit | Attending: Family Medicine | Admitting: Family Medicine

## 2016-04-08 DIAGNOSIS — N898 Other specified noninflammatory disorders of vagina: Secondary | ICD-10-CM

## 2016-04-08 DIAGNOSIS — Z880 Allergy status to penicillin: Secondary | ICD-10-CM | POA: Insufficient documentation

## 2016-04-08 DIAGNOSIS — O26893 Other specified pregnancy related conditions, third trimester: Secondary | ICD-10-CM | POA: Insufficient documentation

## 2016-04-08 DIAGNOSIS — Z3A33 33 weeks gestation of pregnancy: Secondary | ICD-10-CM | POA: Insufficient documentation

## 2016-04-08 LAB — POCT FERN TEST: POCT Fern Test: NEGATIVE

## 2016-04-08 LAB — AMNISURE RUPTURE OF MEMBRANE (ROM) NOT AT ARMC: AMNISURE: NEGATIVE

## 2016-04-08 NOTE — Telephone Encounter (Signed)
Patient ([redacted] weeks pregnant) is concerned because she had hot water discharge twice this morning. Also, she is dizzy. Please, follow up.

## 2016-04-08 NOTE — MAU Note (Signed)
Pt presents to MAU with complaints of leakage of fluid today around 11am. Denies any vaginal bleeding or pain

## 2016-04-08 NOTE — MAU Provider Note (Signed)
History     CSN: [id number]  Arrival date and time: 04/08/16 1247   None     No chief complaint on file.  HPI   Ms.Wendy Morrison is a 20 y.o. female G2P0010 @ 3611w5d here in MAU with ?ROM. She felt some white discharge leaking today around 1100. She did not have a large gush.  + fetal movement  Denies vaginal bleeding   OB History    Gravida Para Term Preterm AB Living   2       1     SAB TAB Ectopic Multiple Live Births                  Past Medical History:  Diagnosis Date  . Hypothyroidism     History reviewed. No pertinent surgical history.  Family History  Problem Relation Age of Onset  . Diabetes Maternal Grandmother   . Cancer Maternal Grandmother   . Diabetes Mother   . Hypertension Mother   . Diabetes Father     Social History  Substance Use Topics  . Smoking status: Never Smoker  . Smokeless tobacco: Never Used  . Alcohol use No    Allergies:  Allergies  Allergen Reactions  . Penicillins Itching and Rash    Has patient had a PCN reaction causing immediate rash, facial/tongue/throat swelling, SOB or lightheadedness with hypotension: yes Has patient had a PCN reaction causing severe rash involving mucus membranes or skin necrosis: no Has patient had a PCN reaction that required hospitalization no Has patient had a PCN reaction occurring within the last 10 years: yes If all of the above answers are "NO", then may proceed with Cephalosporin use.     Facility-Administered Medications Prior to Admission  Medication Dose Route Frequency Provider Last Rate Last Dose  . miconazole (MICOTIN) vaginal suppository 100 mg  100 mg Vaginal QHS Bonney AidAlyssa A Haney, MD       Prescriptions Prior to Admission  Medication Sig Dispense Refill Last Dose  . docusate sodium (COLACE) 100 MG capsule Take 1 capsule (100 mg total) by mouth 2 (two) times daily as needed for mild constipation. 30 capsule 0 04/07/2016 at Unknown time  . Prenatal Vit-Fe Fumarate-FA  (PRENATAL MULTIVITAMIN) TABS tablet Take 1 tablet by mouth daily at 12 noon.   04/07/2016 at Unknown time  . miconazole (MICONAZOLE 7) 100 MG vaginal suppository Place 1 suppository (100 mg total) vaginally at bedtime. (Patient not taking: Reported on 04/08/2016) 7 suppository 0 Not Taking at Unknown time  . vitamin B-6 (PYRIDOXINE) 25 MG tablet Take 1 tablet (25 mg total) by mouth every 8 (eight) hours as needed (nausea). (Patient not taking: Reported on 04/08/2016) 30 tablet 0 Not Taking at Unknown time   Results for orders placed or performed during the hospital encounter of 04/08/16 (from the past 48 hour(s))  Fern Test     Status: Normal   Collection Time: 04/08/16  1:16 PM  Result Value Ref Range   POCT Fern Test Negative = intact amniotic membranes   Amnisure rupture of membrane (rom)[facility]     Status: None   Collection Time: 04/08/16  1:17 PM  Result Value Ref Range   Amnisure ROM NEGATIVE     Review of Systems  Constitutional: Negative for chills and fever.  Gastrointestinal: Negative for nausea and vomiting.  Genitourinary: Negative for dysuria and urgency.   Physical Exam   Blood pressure 102/60, pulse 91, temperature 98.6 F (37 C), resp. rate 16, last menstrual  period 08/28/2015.  Physical Exam  Constitutional: She is oriented to person, place, and time. She appears well-developed and well-nourished. No distress.  HENT:  Head: Normocephalic.  Respiratory: Effort normal.  Genitourinary:  Genitourinary Comments: Vagina - Small amount of white vaginal discharge, no odor. No pooling of fluid  Cervix - No contact bleeding, no active bleeding, visually closed Bimanual exam: deferred  Chaperone present for exam.   Musculoskeletal: Normal range of motion.  Neurological: She is alert and oriented to person, place, and time.  Skin: Skin is warm. She is not diaphoretic.  Psychiatric: Her behavior is normal.   Fetal Tracing: Baseline: 140 Variability: Moderate   Accelerations: 15x15 Decelerations: none Toco: none  MAU Course  Procedures  None  MDM  Fern slide negative Amnisure negative.   Assessment and Plan   A:  1. Vaginal discharge in pregnancy in third trimester     P:  Discharge home in stable condition Follow up with OB as scheduled Return to MAU as needed, if symptoms worsen   Duane Lope, NP 04/08/2016 2:06 PM

## 2016-04-08 NOTE — Discharge Instructions (Signed)

## 2016-04-08 NOTE — Telephone Encounter (Signed)
Advised patient go to MAU for further evaluation.  Clovis PuMartin, Mykael Batz L, RN

## 2016-04-23 ENCOUNTER — Ambulatory Visit (INDEPENDENT_AMBULATORY_CARE_PROVIDER_SITE_OTHER): Payer: Self-pay | Admitting: Family Medicine

## 2016-04-23 VITALS — BP 103/66 | HR 76 | Temp 97.8°F | Wt 167.0 lb

## 2016-04-23 DIAGNOSIS — Z3483 Encounter for supervision of other normal pregnancy, third trimester: Secondary | ICD-10-CM

## 2016-04-23 NOTE — Progress Notes (Signed)
Subjective:  Wendy PasseySofia Morrison is a 20 y.o. G2P0010 at 2196w6d being seen today for ongoing prenatal care.  She is currently monitored for the following issues for this low-risk pregnancy and has Nausea with vomiting; Hypothyroidism; Dizziness; Encounter for supervision of other normal pregnancy; Lower back pain; and Rubella non-immune status, antepartum on her problem list.  Patient reports no complaints. Denies VB. Denies contractions. Denies leaking of fluid. Good fetal movement.  The following portions of the patient's history were reviewed and updated as appropriate: allergies, current medications, past family history, past medical history, past social history, past surgical history and problem list. Problem list updated.  Objective:   Vitals:   04/23/16 1130  BP: 103/66  Pulse: 76  Temp: 97.8 F (36.6 C)  Weight: 167 lb (75.8 kg)    Fetal Status: Dopplers: 135 bpm; FH: 35cm. Vertex  General:  Alert, oriented and cooperative. Patient is in no acute distress.  Skin: Skin is warm and dry. No rash noted.   Cardiovascular: Normal heart rate noted  Respiratory: Normal respiratory effort, no problems with respiration noted  Abdomen: Soft, gravid, appropriate for gestational age.       Pelvic:  Cervical exam deferred       Bedside US to determine position, confirmed cephalic.  Extremities: Normal range of motion.     Mental Status: Normal mood and affect. Normal behavior. Normal judgment and thought content.   Urinalysis:      Assessment and Plan:  Pregnancy: G2P0010 at 7396w6d  1. Encounter for supervision of other normal pregnancy in third trimester - PCN allergy consists of rash, likely cephalosporin - Strep B DNA Probe, sensitivities to be performed due to PCN allergy.  Preterm labor symptoms and general obstetric precautions including but not limited to vaginal bleeding, contractions, leaking of fluid and fetal movement were reviewed in detail with the patient. Please refer to  After Visit Summary for other counseling recommendations.  Return in about 1 week (around 04/30/2016) for Routine OB visit.   Michaele OfferElizabeth Woodland Mumaw, DO OB Fellow - Family Medicine [place] [facility]

## 2016-04-23 NOTE — Patient Instructions (Signed)
Tercer trimestre de embarazo  (Third Trimester of Pregnancy)  El tercer trimestre comprende desde la semana 29 hasta la semana 42, es decir, desde el mes 7 hasta el mes 9. En este trimestre, el feto crece muy rápido. Hacia el final del noveno mes, el feto mide alrededor de 20 pulgadas (45 cm) de largo y pesa entre 6 y 10 libras (2,700 y 4,500 kg).   CUIDADOS EN EL HOGAR   · No fume, no consuma hierbas ni beba alcohol. No tome fármacos que el médico no haya autorizado.  · No consuma ningún producto que contenga tabaco, lo que incluye cigarrillos, tabaco de mascar o cigarrillos electrónicos. Si necesita ayuda para dejar de fumar, consulte al médico. Puede recibir asesoramiento u otro tipo de apoyo para dejar de fumar.  · Tome los medicamentos solamente como se lo haya indicado el médico. Algunos medicamentos son seguros para tomar durante el embarazo y otros no lo son.  · Haga ejercicios solamente como se lo haya indicado el médico. Interrumpa la actividad física si comienza a tener calambres.  · Ingiera alimentos saludables de manera regular.  · Use un sostén que le brinde buen soporte si sus mamas están sensibles.  · No se dé baños de inmersión en agua caliente, baños turcos ni saunas.  · Colóquese el cinturón de seguridad cuando conduzca.  · No coma carne cruda ni queso sin cocinar; evite el contacto con las bandejas sanitarias de los gatos y la tierra que estos animales usan.  · Tome las vitaminas prenatales.  · Tome entre 1500 y 2000 mg de calcio diariamente comenzando en la semana 20 del embarazo hasta el parto.  · Pruebe tomar un medicamento que la ayude a defecar (un laxante suave) si el médico lo autoriza. Consuma más fibra, que se encuentra en las frutas y verduras frescas y los cereales integrales. Beba suficiente líquido para mantener el pis (orina) claro o de color amarillo pálido.  · Dese baños de asiento con agua tibia para aliviar el dolor o las molestias causadas por las hemorroides. Use una crema  para las hemorroides si el médico la autoriza.  · Si se le hinchan las venas (venas varicosas), use medias de descanso. Levante (eleve) los pies durante 15 minutos, 3 o 4 veces por día. Limite el consumo de sal en su dieta.  · No levante objetos pesados, use zapatos de tacones bajos y siéntese derecha.  · Descanse con las piernas elevadas si tiene calambres o dolor de cintura.  · Visite a su dentista si no lo ha hecho durante el embarazo. Use un cepillo de cerdas suaves para cepillarse los dientes. Pásese el hilo dental con suavidad.  · Puede seguir manteniendo relaciones sexuales, a menos que el médico le indique lo contrario.  · No haga viajes de larga distancia, excepto si es obligatorio y solamente con la aprobación del médico.  · Tome clases prenatales.  · Practique ir manejando al hospital.  · Prepare el bolso que llevará al hospital.  · Prepare la habitación del bebé.  · Concurra a los controles médicos.  SOLICITE AYUDA SI:  · No está segura de si está en trabajo de parto o si ha roto la bolsa de las aguas.  · Tiene mareos.  · Siente calambres leves o presión en la parte inferior del abdomen.  · Sufre un dolor persistente en el abdomen.  · Tiene malestar estomacal (náuseas), vómitos, o tiene deposiciones acuosas (diarrea).  · Advierte un olor fétido que proviene de la vagina.  · Siente dolor al orinar.  SOLICITE AYUDA DE INMEDIATO SI:   ·   Tiene fiebre.  · Tiene una pérdida de líquido por la vagina.  · Tiene sangrado o pequeñas pérdidas vaginales.  · Siente dolor intenso o cólicos en el abdomen.  · Sube o baja de peso rápidamente.  · Tiene dificultades para recuperar el aliento y siente dolor en el pecho.  · Súbitamente se le hinchan mucho el rostro, las manos, los tobillos, los pies o las piernas.  · No ha sentido los movimientos del bebé durante una hora.  · Siente un dolor de cabeza intenso que no se alivia con medicamentos.  · Su visión se modifica.     Esta información no tiene como fin reemplazar el  consejo del médico. Asegúrese de hacerle al médico cualquier pregunta que tenga.     Document Released: 02/08/2013 Document Revised: 06/29/2014  Elsevier Interactive Patient Education ©2016 Elsevier Inc.

## 2016-04-24 ENCOUNTER — Encounter: Payer: Self-pay | Admitting: Internal Medicine

## 2016-04-25 LAB — CULTURE, BETA STREP (GROUP B ONLY)

## 2016-04-28 ENCOUNTER — Other Ambulatory Visit (HOSPITAL_COMMUNITY): Admission: RE | Admit: 2016-04-28 | Payer: Self-pay | Source: Ambulatory Visit | Admitting: Family Medicine

## 2016-04-28 ENCOUNTER — Ambulatory Visit (INDEPENDENT_AMBULATORY_CARE_PROVIDER_SITE_OTHER): Payer: Self-pay | Admitting: Family Medicine

## 2016-04-28 VITALS — BP 113/67 | HR 83 | Temp 97.4°F | Wt 174.0 lb

## 2016-04-28 DIAGNOSIS — Z3483 Encounter for supervision of other normal pregnancy, third trimester: Secondary | ICD-10-CM

## 2016-04-28 DIAGNOSIS — O329XX0 Maternal care for malpresentation of fetus, unspecified, not applicable or unspecified: Secondary | ICD-10-CM

## 2016-04-28 NOTE — Progress Notes (Signed)
   Subjective:  Wendy Morrison is a 20 y.o. G2P0010 at 371w4d being seen today for ongoing prenatal care.  She is currently monitored for the following issues for this low-risk pregnancy and has Nausea with vomiting; Hypothyroidism; Dizziness; Encounter for supervision of other normal pregnancy; Lower back pain; and Rubella non-immune status, antepartum on her problem list.  Patient reports no complaints.   .  .   . Denies leaking of fluid.   The following portions of the patient's history were reviewed and updated as appropriate: allergies, current medications, past family history, past medical history, past social history, past surgical history and problem list. Problem list updated.  Objective:   Vitals:   04/28/16 1521  BP: 113/67  Pulse: 83  Temp: 97.4 F (36.3 C)  Weight: 174 lb (78.9 kg)    Fetal Status: Dopplers 135 bpm; FH: 36cm; TRANSVERSE LIE, confirmed with bedside US  General:  Alert, oriented and cooperative. Patient is in no acute distress.  Skin: Skin is warm and dry. No rash noted.   Cardiovascular: Normal heart rate noted  Respiratory: Normal respiratory effort, no problems with respiration noted  Abdomen: Soft, gravid, appropriate for gestational age.       Pelvic:  Cervical exam performed   0/thick/ballotable. Head not felt in pelvis, transverse lie, confirmed with bedside US  Extremities: Normal range of motion.     Mental Status: Normal mood and affect. Normal behavior. Normal judgment and thought content.   Urinalysis:      Assessment and Plan:  Pregnancy: G2P0010 at 471w4d  1. Encounter for supervision of other normal pregnancy in third trimester - Cervicovaginal ancillary only  2. Malpresentation before onset of labor, single or unspecified fetus - External cephalic version discussed with patient with interpretor. Patient in agreement to have performed as she desires vaginal delivery. Information given. Appt made for hospital version if does not meet  appropriate lie at next visit.   Preterm labor symptoms and general obstetric precautions including but not limited to vaginal bleeding, contractions, leaking of fluid and fetal movement were reviewed in detail with the patient. Please refer to After Visit Summary for other counseling recommendations.  Return in about 1 week (around 05/05/2016) for Routine OB visit; 05/07/16 External Cephalic Version at hospital, be there at 7am FASTING.Michaele Offer.   Elizabeth Woodland Mumaw, DO OB Fellow - [facility]

## 2016-04-28 NOTE — Patient Instructions (Addendum)
Versin ceflica externa (External Cephalic Version) La versin ceflica externa es dar vuelta a un beb que tiene las nalgas hacia adelante o est de costado en el tero (trasverso) para colocarlo en una posicin de cabeza. Esto agiliza el trabajo de parto y el nacimiento, lo hace ms seguro para la madre y el beb y Sports coachdisminuye la posibilidad de Warehouse managertener que realizar una cesrea. No debera realizarse hasta que el embarazo sea de 36 semanas o ms. ANTES DEL PROCEDIMIENTO  No tome aspirina  No coma durante las cuatro horas previas al procedimiento.  Comente al mdico si tiene resfro, fiebre o una infeccin.  Informe a su mdico si sufre contracciones.  Informe al mdico si tiene una prdida de lquido por la vagina.  Informe al mdico si tiene hemorragia vaginal o secrecin anormal.  Si es admitida el mismo da de la [place]uga, Oceanographerconcurra al hospital al menos una hora antes del procedimiento para leer y Oceanographerfirmar los formularios y Development worker, communityconsentimiento, y Theme park managerestar preparada.  Consulte con el profesional que lo asiste si ha tenido algn problema con anestsicos anteriormente.  Informe al mdico si ha estado tomado medicamentos. Esto incluye medicamentos de venta libre y con receta, hierbas, gotas oftlmicas y cremas. PROCEDIMIENTO  Primero se realiza un ultrasonido para asegurarse de que el beb est de nalgas o trasverso.  Se realizar una prueba de no estrs o perfil biofsico al beb antes de la VCE. Esto se realiza para asegurarse de que es seguro realizar la VCE al beb. Tambin podr realizarse despus del procedimiento para asegurarse de que el beb est bien.  La VCE se realiza en la sala de parto con la presencia de Heritage managerun anestesilogo. Deber haber instrumental para una cesrea de emergencia con un equipo de mdicos disponible.  Se le dar a la paciente medicacin para Yahoorelajar los msculos uterinos. Se podr administrar una vacuna epidural para cualquier molestia. Esto es til para el xito de la  VCE.  Tambin se coloca un monitor electrnico fetal durante el procedimiento para asegurarse de que el beb est bien.  Si la madre es Rh negativo, se administrar Rho-gam para prevenir problemas de Rh en los embarazos futuros.  La madre quedar en observacin durante 2 a 3 horas despus del procedimiento para asegurarse de que no ha habido problemas. BENEFICIOS DE LA VCE  Trabajo de parto ms fcil y seguro para la madre y para el beb.  Menor incidencia de Copycesrea.  Menores costos por parto vaginal. RIESGOS DE LA VCE  La placenta se desplaza de la pared del tero antes del parto (abrupcin de la placenta).  Ruptura del tero, en especial en pacientes con corte de cesrea previo.  Estrs fetal.  Parto prematuro.  Ruptura prematura de las High Bridgemembranas.  El beb puede volver a ponerse de nalgas o trasverso.  Puede ocurrir Newmont Miningmuerte fetal, pero esto es muy poco comn. LA VCE DEBER DETENERSE SI:  El ritmo cardaco fetal decae.  La madre siente AmerisourceBergen Corporationmucho dolor.  No se puede dar vuelta al beb despus de varios intentos. LA VCE NO DEBER EFECTUARSE SI:  La prueba de no estrs o perfil biofsico es anormal.  Hay hemorragia vaginal.  Forma anormal del tero.  Hay insuficiencia cardaca o presin alta no controlada en la madre.  Embarazo de gemelos o ms bebs.  La placenta cubre la apertura del cuello del tero (placenta previa).  Ha tenido una cesrea anterior con una incisin clsica o ciruga mayor del tero.  Insuficiencia de lquido Ross Storesamnitico en la  bolsa del beb (oligohidramnios).  El beb es muy pequeo o no se ha desarrollado normalmente (anomala).  Ruptura de [place]opl[place] Grovemembranas. INSTRUCCIONES PARA EL CUIDADO DOMICILIARIO  Pdale a alguna persona que la lleve hasta su domicilio despus del procedimiento.  Haga reposo en su casa durante varias horas.  Haga que alguien permanezca con usted cuando regrese a su casa, al menos durante las primeras horas.  Luego de la VCE,  contine con las actividades prenatales segn se le haya indicado.  Contine con su dieta normal, reposo y [address] Placeactividades.  No realice actividades estresantes por un par Kinder Morgan Energyde das. SOLICITE ATENCIN MDICA DE INMEDIATO SI:  Presenta una hemorragia vaginal abundante.  Presenta una secrecin vaginal (la bolsa de agua podra haberse roto).  Tiene contracciones uterinas.  No siente que el beb se mueve, o percibe menos movimientos que antes.  Siente dolor abdominal.  La temperatura oral se eleva por encima de 38,9 C (102 F) o mayor.   Esta informacin no tiene Theme park managercomo fin reemplazar el consejo del mdico. Asegrese de hacerle al mdico cualquier pregunta que tenga.   Document Released: 11/25/2007 Document Revised: 08/31/2011 Elsevier Interactive Patient Education 2016 ArvinMeritorElsevier Inc.    Systems analystTercer trimestre de Psychiatristembarazo (Third Trimester of Pregnancy) El tercer trimestre comprende desde la semana29 The ServiceMaster Companyhasta la semana42, es decir, desde el mes7 hasta el [address]9. En este trimestre, el feto crece muy rpido. Hacia el final del noveno mes, el feto mide alrededor de 20pulgadas (45cm) de largo y pesa entre 6y 10libras [phone](2,700y 4,500kg).  CUIDADOS EN EL HOGAR   No fume, no consuma hierbas ni beba alcohol. No tome frmacos que el mdico no haya autorizado.  No consuma ningn producto que contenga tabaco, lo que incluye cigarrillos, tabaco de Theatre managermascar o Administrator, Civil Servicecigarrillos electrnicos. Si necesita ayuda para dejar de fumar, consulte al American Expressmdico. Puede recibir asesoramiento u otro tipo de apoyo para dejar de fumar.  Tome los medicamentos solamente como se lo haya indicado el mdico. Algunos medicamentos son seguros para tomar durante el Psychiatristembarazo y otros no lo son.  Haga ejercicios solamente como se lo haya indicado el mdico. Interrumpa la actividad fsica si comienza a tener calambres.  Ingiera alimentos saludables de Mandersonmanera regular.  Use un sostn que le brinde buen soporte si sus mamas estn  sensibles.  No se d baos de inmersin en agua caliente, baos turcos ni saunas.  Colquese el cinturn de seguridad cuando conduzca.  No coma carne cruda ni queso sin cocinar; evite el contacto con las bandejas sanitarias de los gatos y la tierra que estos animales usan.  Tome las vitaminas prenatales.  Tome entre 1500 y 2000mg  de calcio diariamente comenzando en la semana20 del embarazo Amityhasta el parto.  Pruebe tomar un medicamento que la ayude a defecar (un laxante suave) si el mdico lo autoriza. Consuma ms fibra, que se encuentra en las frutas y verduras frescas y los cereales integrales. Beba suficiente lquido para mantener el pis (orina) claro o de color amarillo plido.  Dese baos de asiento con agua tibia para Engineer, materialsaliviar el dolor o las molestias causadas por las hemorroides. Use una crema para las hemorroides si el mdico la autoriza.  Si se le hinchan las venas (venas varicosas), use medias de descanso. Levante (eleve) los pies durante 15minutos, 3 o 4veces por Futures traderda. Limite el consumo de sal en su dieta.  No levante objetos pesados, use zapatos de tacones bajos y sintese derecha.  Descanse con las piernas elevadas si tiene calambres o dolor de cintura.  Visite  a su dentista si no lo ha Occupational hygienisthecho durante el embarazo. Use un cepillo de cerdas suaves para cepillarse los dientes. Psese el hilo dental con suavidad.  Puede seguir Calpine Corporationmanteniendo relaciones sexuales, a menos que el mdico le indique lo contrario.  No haga viajes de larga distancia, excepto si es obligatorio y solamente con la aprobacin del mdico.  Tome clases prenatales.  Practique ir manejando al hospital.  Prepare el bolso que llevar al hospital.  Prepare la habitacin del beb.  Concurra a los controles mdicos. SOLICITE AYUDA SI:  No est segura de si est en trabajo de parto o si ha roto la bolsa de las aguas.  Tiene mareos.  Siente calambres leves o presin en la parte inferior del  abdomen.  Sufre un dolor persistente en el abdomen.  Tiene Programme researcher, broadcasting/film/videomalestar estomacal (nuseas), vmitos, o tiene deposiciones acuosas (diarrea).  Advierte un olor ftido que proviene de la vagina.  Siente dolor al ConocoPhillipsorinar. SOLICITE AYUDA DE INMEDIATO SI:   Tiene fiebre.  Tiene una prdida de lquido por la vagina.  Tiene sangrado o pequeas prdidas vaginales.  Siente dolor intenso o clicos en el abdomen.  Sube o baja de peso rpidamente.  Tiene dificultades para recuperar el aliento y siente dolor en el pecho.  Sbitamente se le hinchan mucho el rostro, las Livingstonmanos, los tobillos, los pies o las piernas.  No ha sentido los movimientos del beb durante Georgianne Fickuna hora.  Siente un dolor de cabeza intenso que no se alivia con medicamentos.  Su visin se modifica.   Esta informacin no tiene Theme park managercomo fin reemplazar el consejo del mdico. Asegrese de hacerle al mdico cualquier pregunta que tenga.   Document Released: 02/08/2013 Document Revised: 06/29/2014 Elsevier Interactive Patient Education [employer]2016 Elsevier Inc.

## 2016-04-29 ENCOUNTER — Encounter (HOSPITAL_COMMUNITY): Payer: Self-pay | Admitting: *Deleted

## 2016-04-29 ENCOUNTER — Telehealth (HOSPITAL_COMMUNITY): Payer: Self-pay | Admitting: *Deleted

## 2016-04-29 LAB — CERVICOVAGINAL ANCILLARY ONLY
Chlamydia: NEGATIVE
Neisseria Gonorrhea: NEGATIVE

## 2016-04-29 NOTE — Telephone Encounter (Signed)
Preadmission screen [phone] interpreter number

## 2016-05-04 ENCOUNTER — Ambulatory Visit (INDEPENDENT_AMBULATORY_CARE_PROVIDER_SITE_OTHER): Payer: Self-pay | Admitting: Family Medicine

## 2016-05-04 VITALS — BP 108/67 | HR 92 | Temp 98.0°F | Wt 173.0 lb

## 2016-05-04 DIAGNOSIS — Z3483 Encounter for supervision of other normal pregnancy, third trimester: Secondary | ICD-10-CM

## 2016-05-04 DIAGNOSIS — Z283 Underimmunization status: Secondary | ICD-10-CM

## 2016-05-04 DIAGNOSIS — Z2839 Other underimmunization status: Secondary | ICD-10-CM

## 2016-05-04 DIAGNOSIS — O9989 Other specified diseases and conditions complicating pregnancy, childbirth and the puerperium: Secondary | ICD-10-CM

## 2016-05-04 NOTE — Patient Instructions (Signed)
Tercer trimestre de embarazo  (Third Trimester of Pregnancy)  El tercer trimestre comprende desde la semana 29 hasta la semana 42, es decir, desde el mes 7 hasta el mes 9. En este trimestre, el feto crece muy rápido. Hacia el final del noveno mes, el feto mide alrededor de 20 pulgadas (45 cm) de largo y pesa entre 6 y 10 libras (2,700 y 4,500 kg).   CUIDADOS EN EL HOGAR   · No fume, no consuma hierbas ni beba alcohol. No tome fármacos que el médico no haya autorizado.  · No consuma ningún producto que contenga tabaco, lo que incluye cigarrillos, tabaco de mascar o cigarrillos electrónicos. Si necesita ayuda para dejar de fumar, consulte al médico. Puede recibir asesoramiento u otro tipo de apoyo para dejar de fumar.  · Tome los medicamentos solamente como se lo haya indicado el médico. Algunos medicamentos son seguros para tomar durante el embarazo y otros no lo son.  · Haga ejercicios solamente como se lo haya indicado el médico. Interrumpa la actividad física si comienza a tener calambres.  · Ingiera alimentos saludables de manera regular.  · Use un sostén que le brinde buen soporte si sus mamas están sensibles.  · No se dé baños de inmersión en agua caliente, baños turcos ni saunas.  · Colóquese el cinturón de seguridad cuando conduzca.  · No coma carne cruda ni queso sin cocinar; evite el contacto con las bandejas sanitarias de los gatos y la tierra que estos animales usan.  · Tome las vitaminas prenatales.  · Tome entre 1500 y 2000 mg de calcio diariamente comenzando en la semana 20 del embarazo hasta el parto.  · Pruebe tomar un medicamento que la ayude a defecar (un laxante suave) si el médico lo autoriza. Consuma más fibra, que se encuentra en las frutas y verduras frescas y los cereales integrales. Beba suficiente líquido para mantener el pis (orina) claro o de color amarillo pálido.  · Dese baños de asiento con agua tibia para aliviar el dolor o las molestias causadas por las hemorroides. Use una crema  para las hemorroides si el médico la autoriza.  · Si se le hinchan las venas (venas varicosas), use medias de descanso. Levante (eleve) los pies durante 15 minutos, 3 o 4 veces por día. Limite el consumo de sal en su dieta.  · No levante objetos pesados, use zapatos de tacones bajos y siéntese derecha.  · Descanse con las piernas elevadas si tiene calambres o dolor de cintura.  · Visite a su dentista si no lo ha hecho durante el embarazo. Use un cepillo de cerdas suaves para cepillarse los dientes. Pásese el hilo dental con suavidad.  · Puede seguir manteniendo relaciones sexuales, a menos que el médico le indique lo contrario.  · No haga viajes de larga distancia, excepto si es obligatorio y solamente con la aprobación del médico.  · Tome clases prenatales.  · Practique ir manejando al hospital.  · Prepare el bolso que llevará al hospital.  · Prepare la habitación del bebé.  · Concurra a los controles médicos.  SOLICITE AYUDA SI:  · No está segura de si está en trabajo de parto o si ha roto la bolsa de las aguas.  · Tiene mareos.  · Siente calambres leves o presión en la parte inferior del abdomen.  · Sufre un dolor persistente en el abdomen.  · Tiene malestar estomacal (náuseas), vómitos, o tiene deposiciones acuosas (diarrea).  · Advierte un olor fétido que proviene de la vagina.  · Siente dolor al orinar.  SOLICITE AYUDA DE INMEDIATO SI:   ·   Tiene fiebre.  · Tiene una pérdida de líquido por la vagina.  · Tiene sangrado o pequeñas pérdidas vaginales.  · Siente dolor intenso o cólicos en el abdomen.  · Sube o baja de peso rápidamente.  · Tiene dificultades para recuperar el aliento y siente dolor en el pecho.  · Súbitamente se le hinchan mucho el rostro, las manos, los tobillos, los pies o las piernas.  · No ha sentido los movimientos del bebé durante una hora.  · Siente un dolor de cabeza intenso que no se alivia con medicamentos.  · Su visión se modifica.     Esta información no tiene como fin reemplazar el  consejo del médico. Asegúrese de hacerle al médico cualquier pregunta que tenga.     Document Released: 02/08/2013 Document Revised: 06/29/2014  Elsevier Interactive Patient Education ©2016 Elsevier Inc.

## 2016-05-04 NOTE — Progress Notes (Signed)
   Subjective:  Wendy Morrison is a 20 y.o. G2P0010 at 2w3dbeing seen today for ongoing prenatal care.  She is currently monitored for the following issues for this low-risk pregnancy and has Nausea with vomiting; Hypothyroidism; Dizziness; Encounter for supervision of other normal pregnancy; Lower back pain; and Rubella non-immune status, antepartum on her problem list.  Patient reports no complaints.  Good fetal movement. Denies contractions. Denies VB. Denies leaking of fluid.   The following portions of the patient's history were reviewed and updated as appropriate: allergies, current medications, past family history, past medical history, past social history, past surgical history and problem list. Problem list updated.  Objective:   Vitals:   05/04/16 0838  BP: 108/67  Pulse: 92  Temp: 98 F (36.7 C)  Weight: 173 lb (78.5 kg)    Fetal Status:           General:  Alert, oriented and cooperative. Patient is in no acute distress.  Skin: Skin is warm and dry. No rash noted.   Cardiovascular: Normal heart rate noted  Respiratory: Normal respiratory effort, no problems with respiration noted  Abdomen: Soft, gravid, appropriate for gestational age.       Pelvic:  Cervical exam deferred        Extremities: Normal range of motion.     Mental Status: Normal mood and affect. Normal behavior. Normal judgment and thought content.   Urinalysis:      Assessment and Plan:  Pregnancy: G2P0010 at 334w3d1. Encounter for supervision of other normal pregnancy in third trimester - Labs UTD, GBS Neg - Received TdaP - VERTEX on exam today, with bedside USKoreao confirm  2. Rubella non-immune status, antepartum - MMR after birth  Term labor symptoms and general obstetric precautions including but not limited to vaginal bleeding, contractions, leaking of fluid and fetal movement were reviewed in detail with the patient. Please refer to After Visit Summary for other counseling  recommendations.  Return in about 1 week (around 05/11/2016) for Routine OB visit.   ElZenda AlpersDO  OB Fellow - [place]

## 2016-05-07 ENCOUNTER — Inpatient Hospital Stay (HOSPITAL_COMMUNITY): Admission: RE | Admit: 2016-05-07 | Payer: Self-pay | Source: Ambulatory Visit

## 2016-05-11 ENCOUNTER — Ambulatory Visit (INDEPENDENT_AMBULATORY_CARE_PROVIDER_SITE_OTHER): Payer: Self-pay | Admitting: Internal Medicine

## 2016-05-11 VITALS — BP 110/68 | HR 112 | Temp 98.3°F | Wt 176.2 lb

## 2016-05-11 DIAGNOSIS — Z3483 Encounter for supervision of other normal pregnancy, third trimester: Secondary | ICD-10-CM

## 2016-05-11 NOTE — Patient Instructions (Addendum)
Don't eat or drink anything 8 hours before appointment. [facility] at 7 AM. Dr. Alvester MorinNewton will be the doctor who see you for this procedure.   Prenatal care make a follow up appointment for next week.   Versin ceflica externa (External Cephalic Version) La versin ceflica externa es dar vuelta a un beb que tiene las nalgas hacia adelante o est de costado en el tero (trasverso) para colocarlo en una posicin de cabeza. Esto agiliza el trabajo de parto y el nacimiento, lo hace ms seguro para la madre y el beb y Sports coachdisminuye la posibilidad de Warehouse managertener que realizar una cesrea. No debera realizarse hasta que el embarazo sea de 36 semanas o ms. ANTES DEL PROCEDIMIENTO  No tome aspirina  Comente al mdico si tiene resfro, fiebre o una infeccin.  Informe a su mdico si sufre contracciones.  Informe al mdico si tiene una prdida de lquido por la vagina.  Informe al mdico si tiene hemorragia vaginal o secrecin anormal.  Si es admitida el mismo da de la [place]iruga, Oceanographerconcurra al hospital al menos una hora antes del procedimiento para leer y Oceanographerfirmar los formularios y Development worker, communityconsentimiento, y Theme park managerestar preparada.  Consulte con el profesional que lo asiste si ha tenido algn problema con anestsicos anteriormente.  Informe al mdico si ha estado tomado medicamentos. Esto incluye medicamentos de venta libre y con receta, hierbas, gotas oftlmicas y cremas. PROCEDIMIENTO  Primero se realiza un ultrasonido para asegurarse de que el beb est de nalgas o trasverso.  Se realizar una prueba de no estrs o perfil biofsico al beb antes de la VCE. Esto se realiza para asegurarse de que es seguro realizar la VCE al beb. Tambin podr realizarse despus del procedimiento para asegurarse de que el beb est bien.  La VCE se realiza en la sala de parto con la presencia de Heritage managerun anestesilogo. Deber haber instrumental para una cesrea de emergencia con un equipo de  mdicos disponible.  Se le dar a la paciente medicacin para Yahoorelajar los msculos uterinos. Se podr administrar una vacuna epidural para cualquier molestia. Esto es til para el xito de la VCE.  Tambin se coloca un monitor electrnico fetal durante el procedimiento para asegurarse de que el beb est bien.  Si la madre es Rh negativo, se administrar Rho-gam para prevenir problemas de Rh en los embarazos futuros.  La madre quedar en observacin durante 2 a 3 horas despus del procedimiento para asegurarse de que no ha habido problemas. BENEFICIOS DE LA VCE  Trabajo de parto ms fcil y seguro para la madre y para el beb.  Menor incidencia de Copycesrea.  Menores costos por parto vaginal. RIESGOS DE LA VCE  La placenta se desplaza de la pared del tero antes del parto (abrupcin de la placenta).  Ruptura del tero, en especial en pacientes con corte de cesrea previo.  Estrs fetal.  Parto prematuro.  Ruptura prematura de las [place]woodmembranas.  El beb puede volver a ponerse de nalgas o trasverso.  Puede ocurrir Newmont Miningmuerte fetal, pero esto es muy poco comn. LA VCE DEBER DETENERSE SI:  El ritmo cardaco fetal decae.  La madre siente AmerisourceBergen Corporationmucho dolor.  No se puede dar vuelta al beb despus de varios intentos. LA VCE NO DEBER EFECTUARSE SI:  La prueba de no estrs o perfil biofsico es anormal.  Hay hemorragia vaginal.  Forma anormal del tero.  Hay insuficiencia cardaca o presin alta no controlada en la madre.  Embarazo de  gemelos o ms bebs.  La placenta cubre la apertura del cuello del tero (placenta previa).  Ha tenido una cesrea anterior con una incisin clsica o ciruga mayor del tero.  Insuficiencia de lquido amnitico en la bolsa del beb (oligohidramnios).  El beb es muy pequeo o no se ha desarrollado normalmente (anomala).  Ruptura de Pembinemembranas. INSTRUCCIONES PARA EL CUIDADO DOMICILIARIO  Pdale a alguna persona que la lleve hasta su domicilio  despus del procedimiento.  Haga reposo en su casa durante varias horas.  Haga que alguien permanezca con usted cuando regrese a su casa, al menos durante las primeras horas.  Luego de la VCE, contine con las actividades prenatales segn se le haya indicado.  Contine con su dieta normal, reposo y [address] Placeactividades.  No realice actividades estresantes por un par Kinder Morgan Energyde das. SOLICITE ATENCIN MDICA DE INMEDIATO SI:  Presenta una hemorragia vaginal abundante.  Presenta una secrecin vaginal (la bolsa de agua podra haberse roto).  Tiene contracciones uterinas.  No siente que el beb se mueve, o percibe menos movimientos que antes.  Siente dolor abdominal.  La temperatura oral se eleva por encima de 38,9C (102F) o mayor. Esta informacin no tiene Theme park managercomo fin reemplazar el consejo del mdico. Asegrese de hacerle al mdico cualquier pregunta que tenga. Document Released: 11/25/2007 Document Revised: 09/30/2015 Document Reviewed: 06/22/2015 Elsevier Interactive Patient Education  2017 ArvinMeritorElsevier Inc.

## 2016-05-11 NOTE — Progress Notes (Signed)
Manson PasseySofia Tirado-Santos is a 20 y.o. G2P0010 at 984w3d for routine follow up.  She reports none. No vaginal bleeding, no contractions, no leakage of fluid .Fetal movement +   See flow sheet for details.  Uterine Size: 38 cm  FHR: 145 bpm  Ultrasound: Positive for Breech position   A/P: Pregnancy at 5984w3d.  Doing well.    Pregnancy issues include none    Infant feeding choice breast feeding  Contraception choice Nexplanon  Infant circumcision desired not applicable  Breech Position: Discussed with Dr. Adrian BlackwaterStinson and Dr. McDiarmid  [facility] at 8 AM, Dr. Alvester MorinNewton    GBS/GC/CZ results were reviewed today.   Labor precautions reviewed. Kick counts reviewed. 1 week follow up

## 2016-05-12 ENCOUNTER — Telehealth (HOSPITAL_COMMUNITY): Payer: Self-pay | Admitting: *Deleted

## 2016-05-12 ENCOUNTER — Encounter (HOSPITAL_COMMUNITY): Payer: Self-pay | Admitting: *Deleted

## 2016-05-12 NOTE — Telephone Encounter (Signed)
Preadmission screen Interpreter number [phone]

## 2016-05-13 ENCOUNTER — Encounter (HOSPITAL_COMMUNITY): Payer: Self-pay

## 2016-05-13 ENCOUNTER — Observation Stay (HOSPITAL_COMMUNITY)
Admission: RE | Admit: 2016-05-13 | Discharge: 2016-05-13 | Disposition: A | Discharge status: Home / Self Care | Payer: Self-pay | Source: Ambulatory Visit | Attending: Family Medicine | Admitting: Family Medicine

## 2016-05-13 DIAGNOSIS — O321XX0 Maternal care for breech presentation, not applicable or unspecified: Principal | ICD-10-CM | POA: Diagnosis present

## 2016-05-13 DIAGNOSIS — Z3A38 38 weeks gestation of pregnancy: Secondary | ICD-10-CM | POA: Insufficient documentation

## 2016-05-13 LAB — OB RESULTS CONSOLE GBS: GBS: NEGATIVE

## 2016-05-13 MED ORDER — TERBUTALINE SULFATE 1 MG/ML IJ SOLN
0.2500 mg | Freq: Once | INTRAMUSCULAR | Status: AC
  Administered 2016-05-13: 0.25 mg via SUBCUTANEOUS
  Filled 2016-05-13: qty 1

## 2016-05-13 MED ORDER — ACETAMINOPHEN 500 MG PO TABS: 500.0000 mg | ORAL_TABLET | Freq: Four times a day (QID) | ORAL | 0 refills | Status: DC | PRN

## 2016-05-13 MED ORDER — LACTATED RINGERS IV SOLN
INTRAVENOUS | Status: DC
  Administered 2016-05-13: 08:00:00 via INTRAVENOUS

## 2016-05-13 NOTE — Procedures (Addendum)
After informed verbal consent, patient signed consent form.  RN administered Terbutaline 0.25 mg SQ  Patient was placed in the supine position. ECV was attempted under Ultrasound guidance. Fetal head was palpated and visualized in the LUQ, forward sumersault was attempted and successful. Confirmed vtx by US.  ECV was successfully performed.   FHR was reactive before and after the procedure.   Pt. Tolerated the procedure well.  Plan: Discharge home Given bleeding and labor precautions Reviewed use of tylenol for pain  Patient has appt scheduled for routine prenatal care Confirm vertex when in labor.   Federico FlakeKimberly Niles Newton, MD, MPH, ABFM Attending Physician Faculty Practice- [facility]

## 2016-05-13 NOTE — Discharge Summary (Signed)
    OB Discharge Summary     Patient Name: Wendy Morrison DOB: August 05, 1995 MRN: [id number]  Date of admission: 05/13/2016 Date of discharge: 05/13/2016  Admitting diagnosis: VERSION Intrauterine pregnancy: 5159w5d     Secondary diagnosis:  Active Problems:   Breech presentation  Additional problems:    Discharge diagnosis: Vertex presentation                                                                                                Hospital course:  Admitted to LD for ECV. Consented and ECV performed successfully  Physical exam  Vitals:   05/13/16 0747 05/13/16 0751  BP: 111/64   Pulse: 94   Resp: 18   Temp: 98.6 F (37 C)   TempSrc: Oral   Weight:  176 lb (79.8 kg)  Height:  5\' 5"  (1.651 m)   General: alert CV: RR Lungs: normal wob  Labs: Lab Results  Component Value Date   WBC 9.0 02/28/2016   HGB 11.6 (L) 02/28/2016   HCT 33.5 (L) 02/28/2016   MCV 89.6 02/28/2016   PLT 234 02/28/2016   CMP Latest Ref Rng & Units 12/10/2015  Glucose 65 - 99 mg/dL 72  BUN 7 - 25 mg/dL 7  Creatinine 4.090.50 - 8.111.10 mg/dL 9.14(N0.46(L)  Sodium 829135 - 562146 mmol/L 137  Potassium 3.5 - 5.3 mmol/L 3.7  Chloride 98 - 110 mmol/L 104  CO2 20 - 31 mmol/L 22  Calcium 8.6 - 10.2 mg/dL 8.8    Discharge instruction: per After Visit Summary and "Baby and Me Booklet".  After visit meds:    Medication List    TAKE these medications   acetaminophen 500 MG tablet Commonly known as:  TYLENOL Take 1 tablet (500 mg total) by mouth every 6 (six) hours as needed for moderate pain.   docusate sodium 100 MG capsule Commonly known as:  COLACE Take 1 capsule (100 mg total) by mouth 2 (two) times daily as needed for mild constipation.   prenatal multivitamin Tabs tablet Take 1 tablet by mouth daily at 12 noon.   vitamin B-6 25 MG tablet Commonly known as:  pyridOXINE Take 1 tablet (25 mg total) by mouth every 8 (eight) hours as needed (nausea).       Diet: routine diet Activity: Advance  as tolerated.  Outpatient follow up:1 week   Follow up Appt:Future Appointments Date Time Provider Department Center  05/19/2016 3:45 PM Asiyah Mayra ReelZahra Mikell, MD [facility]   05/13/2016 Federico FlakeKimberly Niles Newton, MD

## 2016-05-13 NOTE — Discharge Instructions (Signed)
You baby is now head down!  Continue to go to your routine care. We will confirm your baby is head down when you are in labor.   You can take tylenol if you have pain. If you have bleeding, severe pain, cramping, contractions please come to the maternity admission unit.

## 2016-05-19 ENCOUNTER — Ambulatory Visit (INDEPENDENT_AMBULATORY_CARE_PROVIDER_SITE_OTHER): Payer: Self-pay | Admitting: Internal Medicine

## 2016-05-19 VITALS — BP 117/74 | HR 99 | Temp 97.6°F | Wt 176.0 lb

## 2016-05-19 DIAGNOSIS — Z3483 Encounter for supervision of other normal pregnancy, third trimester: Secondary | ICD-10-CM

## 2016-05-19 NOTE — Progress Notes (Signed)
Manson PasseySofia Tirado-Santos is a 20 y.o. G2P0010 at 4252w4d for routine follow up.  She reports no vaginal bleeding, no leakage of fluid, no regular contractions.  + Fetal movement   See flow sheet for details.  A/P: Pregnancy at 7952w4d.  Doing well.   Pregnancy issues include successful external cephalic version on 11/22 as patient was noted to be breech previously. Braxton-Hicks - contraction x 1.   FHR: 147 bpm Uterine Size: 39 cm  Ultrasound for position: cephalic   Infant feeding choice breast feeding   Contraception choice Nexplanon  Infant circumcision desired not applicable GBS/GC/CZ results were reviewed previously   Braxton-hicks handout given - discussed contractions  Labor precautions reviewed. Kick counts reviewed. NST x 2 schedule for December 1st and December 5th  Next OB visit to be scheduled on 05/27/2016 - if patient still pregnant will need to schedule induction for postdates.

## 2016-05-19 NOTE — H&P (Signed)
OBSTETRIC ADMISSION HISTORY AND PHYSICAL  Wendy Morrison is a 20 y.o. female G2P0010 with IUP at 8594w5d presenting with breech presentation for external cephalic version. Otherwise uncomplicated pregnancy  Prenatal History/Complications:  Past Medical History: Past Medical History:  Diagnosis Date  . Hypothyroidism     Past Surgical History: Past Surgical History:  Procedure Laterality Date  . NO PAST SURGERIES      Obstetrical History: OB History    Gravida Para Term Preterm AB Living   2       1     SAB TAB Ectopic Multiple Live Births                  Social History: Social History   Social History  . Marital status: Single    Spouse name: N/A  . Number of children: N/A  . Years of education: N/A   Social History Main Topics  . Smoking status: Never Smoker  . Smokeless tobacco: Never Used  . Alcohol use No  . Drug use: No  . Sexual activity: Yes    Birth control/ protection: None   Other Topics Concern  . None   Social History Narrative  . None    Family History: Family History  Problem Relation Age of Onset  . Cancer Maternal Grandmother   . Diabetes Mother   . Hypertension Mother   . Diabetes Father     Allergies: Allergies  Allergen Reactions  . Penicillins Itching and Rash    Has patient had a PCN reaction causing immediate rash, facial/tongue/throat swelling, SOB or lightheadedness with hypotension: yes Has patient had a PCN reaction causing severe rash involving mucus membranes or skin necrosis: no Has patient had a PCN reaction that required hospitalization no Has patient had a PCN reaction occurring within the last 10 years: yes If all of the above answers are "NO", then may proceed with Cephalosporin use.     No prescriptions prior to admission.     Review of Systems   All systems reviewed and negative except as stated in HPI  Blood pressure 111/64, pulse 94, temperature 98.6 F (37 C), temperature source Oral, resp.  rate 18, height 5\' 5"  (1.651 m), weight 176 lb (79.8 kg), last menstrual period 08/28/2015. General appearance: alert, cooperative and appears stated age Lungs: clear to auscultation bilaterally Heart: regular rate and rhythm Abdomen: soft, non-tender; bowel sounds normal Extremities: Homans sign is negative, no sign of DVT DTR's wnl Presentation: breech Fetal monitoringBaseline: 140 bpm, Variability: Good {> 6 bpm), Accelerations: Reactive and Decelerations: Absent Uterine activityNone    Prenatal labs: ABO, Rh: B/POS/-- (06/05 1048) Antibody: NEG (06/05 1048) Rubella: <0.90 (06/05 1048) RPR: NON REAC (09/08 1509)  HBsAg: NEGATIVE (06/05 1048)  HIV: NONREACTIVE (09/08 1509)  GBS: Negative (11/22 0758)    No results found for this or any previous visit (from the past 24 hour(s)).  Patient Active Problem List   Diagnosis Date Noted  . Breech presentation 05/13/2016  . Rubella non-immune status, antepartum 04/02/2016  . Lower back pain 02/20/2016  . Encounter for supervision of other normal pregnancy 01/15/2016  . Hypothyroidism 12/10/2015  . Dizziness 12/10/2015  . Nausea with vomiting 12/03/2015    Assessment/Plan:  Wendy Morrison is a 20 y.o. G2P0010 presenting for ECV  #Breech- here for ECV. US to bedside and plan for terbutaline. Patient was consented for procedure and fetus was confirmed to be breech via US with fetal head in the LUQ.   Federico FlakeKimberly Niles Newton, MD  05/19/2016, 8:19 AM

## 2016-05-19 NOTE — Patient Instructions (Addendum)
[facility] discussed for two appointments check on baby on December 1st and December 5th. I want you to make an appointment on December 6th as you need to follow up with us if you have not delivered at that point in time. We will need to do make plans for you to be induced at that time.   Contracciones de Designer, multimediaBraxton Hicks (Braxton Hicks Contractions) Durante el Hopewellembarazo, pueden presentarse contracciones uterinas que no siempre indican que est en Beltontrabajo de parto. QU SON LAS CONTRACCIONES DE BRAXTON HICKS? Las State Farmcontracciones que se presentan antes del Noanktrabajo de Parralparto se conocen como contracciones de Morgan's Point ResortBraxton Hicks o falso trabajo de Helenaparto. Hacia el final del embarazo (32 a 34semanas), estas contracciones pueden aparecen con ms frecuencia y volverse ms intensas. No corresponden al Aleen Campitrabajo de parto verdadero porque estas contracciones no producen el agrandamiento (la dilatacin) y el afinamiento del cuello del tero. Algunas veces, es difcil distinguirlas del trabajo de parto verdadero porque en algunos casos pueden ser D.R. Horton, Incmuy intensas, y las personas tienen diferentes niveles de tolerancia al Merck & Codolor. No debe sentirse avergonzada si concurre al hospital con falso trabajo de Kellerparto. En ocasiones, la nica forma de saber si el trabajo de parto es verdadero es que el mdico determine si hay cambios en el cuello del tero. Si no hay problemas prenatales u otras complicaciones de salud asociadas con el embarazo, no habr inconvenientes si la envan a su casa con falso trabajo de parto y espera que comience el verdadero. CMO DIFERENCIAR EL TRABAJO DE PARTO FALSO DEL VERDADERO Falso trabajo de parto   Las contracciones del falso trabajo de parto duran menos y no son tan intensas como las verdaderas.  Generalmente son irregulares.  A menudo, se sienten en la parte delantera de la parte baja del abdomen y en la ingle,  y pueden desaparecer cuando camina o cambia de posicin  mientras est acostada.  Las contracciones se vuelven ms dbiles y su duracin es Adult nursemenor a medida que el tiempo transcurre.  Por lo general, no se hacen progresivamente ms intensas, regulares y Herbalistcercanas entre s como en el caso del Pellatrabajo de parto verdadero. Theodis BlazeVerdadero trabajo de parto   Las contracciones del verdadero trabajo de parto duran de 30 a 70segundos, son muy regulares y suelen volverse ms intensas, y Lesothoaumenta su frecuencia.  No desaparecen cuando camina.  La molestia generalmente se siente en la parte superior del tero y se extiende hacia la zona inferior del abdomen y Parker Hannifinhacia la cintura.  El mdico podr examinarla para determinar si el trabajo de parto es verdadero. El examen mostrar si el cuello del tero se est dilatando y Forestonafinando. LO QUE DEBE RECORDAR  Contine haciendo los ejercicios habituales y siga otras indicaciones que el mdico le d.  Tome todos los medicamentos como le indic el mdico.  OceanographerConcurra a las visitas prenatales regulares.  Coma y beba con moderacin si cree que est en trabajo de parto.  Si las contracciones de Dole FoodBraxton Hicks le provocan incomodidad:  Cambie de posicin: si est acostada o descansando, camine; si est caminando, descanse.  Sintese y descanse en una baera con agua tibia.  Beba 2 o 3vasos de Franceagua. La deshidratacin puede provocar contracciones.  Respire lenta y profundamente varias veces por hora. CUNDO DEBO BUSCAR ASISTENCIA MDICA INMEDIATA? Solicite atencin mdica de inmediato si:  Las contracciones se intensifican, se hacen ms regulares y Arboriculturistcercanas entre s.  Tiene una prdida de lquido por  la vagina.  Tiene fiebre.  Elimina mucosidad manchada con Archersangre.  Tiene una hemorragia vaginal abundante.  Tiene dolor abdominal permanente.  Tiene un dolor en la zona lumbar que nunca tuvo antes.  Siente que la cabeza del beb empuja hacia abajo y ejerce presin en la zona plvica.  El beb no se mueve Education officer, museumtanto como  sola. Esta informacin no tiene Theme park managercomo fin reemplazar el consejo del mdico. Asegrese de hacerle al mdico cualquier pregunta que tenga. Document Released: 03/18/2005 Document Revised: 09/30/2015 Document Reviewed: 03/20/2013 Elsevier Interactive Patient Education  2017 [employer]vier [employer]

## 2016-05-22 ENCOUNTER — Ambulatory Visit (HOSPITAL_COMMUNITY)
Admission: RE | Admit: 2016-05-22 | Discharge: 2016-05-22 | Disposition: A | Discharge status: Home / Self Care | Payer: Self-pay | Source: Ambulatory Visit | Attending: Family Medicine | Admitting: Family Medicine

## 2016-05-22 ENCOUNTER — Other Ambulatory Visit: Payer: Self-pay | Admitting: Internal Medicine

## 2016-05-22 DIAGNOSIS — Z3A40 40 weeks gestation of pregnancy: Secondary | ICD-10-CM | POA: Insufficient documentation

## 2016-05-22 DIAGNOSIS — O480 Post-term pregnancy: Secondary | ICD-10-CM

## 2016-05-22 DIAGNOSIS — Z3483 Encounter for supervision of other normal pregnancy, third trimester: Secondary | ICD-10-CM

## 2016-05-26 ENCOUNTER — Ambulatory Visit (HOSPITAL_COMMUNITY)
Admission: RE | Admit: 2016-05-26 | Discharge: 2016-05-26 | Disposition: A | Discharge status: Home / Self Care | Payer: Self-pay | Source: Ambulatory Visit | Attending: Family Medicine | Admitting: Family Medicine

## 2016-05-26 DIAGNOSIS — Z3A40 40 weeks gestation of pregnancy: Secondary | ICD-10-CM | POA: Insufficient documentation

## 2016-05-26 DIAGNOSIS — Z3483 Encounter for supervision of other normal pregnancy, third trimester: Secondary | ICD-10-CM

## 2016-05-26 DIAGNOSIS — O480 Post-term pregnancy: Secondary | ICD-10-CM | POA: Insufficient documentation

## 2016-05-27 ENCOUNTER — Encounter (HOSPITAL_COMMUNITY): Payer: Self-pay

## 2016-05-27 ENCOUNTER — Encounter: Payer: Self-pay | Admitting: Internal Medicine

## 2016-05-27 ENCOUNTER — Inpatient Hospital Stay (HOSPITAL_COMMUNITY)
Admission: AD | Admit: 2016-05-27 | Discharge: 2016-05-31 | DRG: 766 | Disposition: A | Discharge status: Home / Self Care | Payer: Medicaid Other | Source: Ambulatory Visit | Attending: Obstetrics and Gynecology | Admitting: Obstetrics and Gynecology

## 2016-05-27 DIAGNOSIS — Z3A40 40 weeks gestation of pregnancy: Secondary | ICD-10-CM

## 2016-05-27 DIAGNOSIS — O99284 Endocrine, nutritional and metabolic diseases complicating childbirth: Secondary | ICD-10-CM | POA: Diagnosis present

## 2016-05-27 DIAGNOSIS — E039 Hypothyroidism, unspecified: Secondary | ICD-10-CM | POA: Diagnosis present

## 2016-05-27 DIAGNOSIS — Z8249 Family history of ischemic heart disease and other diseases of the circulatory system: Secondary | ICD-10-CM

## 2016-05-27 DIAGNOSIS — Z833 Family history of diabetes mellitus: Secondary | ICD-10-CM

## 2016-05-27 MED ORDER — LACTATED RINGERS IV SOLN
500.0000 mL | INTRAVENOUS | Status: DC | PRN
  Administered 2016-05-28: 500 mL via INTRAVENOUS

## 2016-05-27 MED ORDER — OXYTOCIN BOLUS FROM INFUSION: 500.0000 mL | Freq: Once | INTRAVENOUS | Status: DC

## 2016-05-27 MED ORDER — OXYTOCIN 40 UNITS IN LACTATED RINGERS INFUSION - SIMPLE MED
2.5000 [IU]/h | INTRAVENOUS | Status: DC
  Filled 2016-05-27: qty 1000

## 2016-05-27 MED ORDER — LACTATED RINGERS IV SOLN
INTRAVENOUS | Status: DC
  Administered 2016-05-27 – 2016-05-28 (×3): via INTRAVENOUS

## 2016-05-27 NOTE — MAU Note (Signed)
Pt reports contractions since 3 am, some spotting.

## 2016-05-28 ENCOUNTER — Encounter (HOSPITAL_COMMUNITY): Payer: Self-pay | Admitting: *Deleted

## 2016-05-28 ENCOUNTER — Inpatient Hospital Stay (HOSPITAL_COMMUNITY): Payer: Medicaid Other | Admitting: Anesthesiology

## 2016-05-28 ENCOUNTER — Encounter (HOSPITAL_COMMUNITY)
Admission: AD | Disposition: A | Discharge status: Home / Self Care | Payer: Self-pay | Source: Ambulatory Visit | Attending: Obstetrics and Gynecology

## 2016-05-28 DIAGNOSIS — O99284 Endocrine, nutritional and metabolic diseases complicating childbirth: Secondary | ICD-10-CM | POA: Diagnosis present

## 2016-05-28 DIAGNOSIS — Z3493 Encounter for supervision of normal pregnancy, unspecified, third trimester: Secondary | ICD-10-CM | POA: Diagnosis present

## 2016-05-28 DIAGNOSIS — E039 Hypothyroidism, unspecified: Secondary | ICD-10-CM | POA: Diagnosis present

## 2016-05-28 DIAGNOSIS — Z8249 Family history of ischemic heart disease and other diseases of the circulatory system: Secondary | ICD-10-CM | POA: Diagnosis not present

## 2016-05-28 DIAGNOSIS — Z833 Family history of diabetes mellitus: Secondary | ICD-10-CM | POA: Diagnosis not present

## 2016-05-28 DIAGNOSIS — Z3A40 40 weeks gestation of pregnancy: Secondary | ICD-10-CM | POA: Diagnosis not present

## 2016-05-28 LAB — CBC
HCT: 34.6 % — ABNORMAL LOW (ref 36.0–46.0)
HEMOGLOBIN: 11.7 g/dL — AB (ref 12.0–15.0)
MCH: 29 pg (ref 26.0–34.0)
MCHC: 33.8 g/dL (ref 30.0–36.0)
MCV: 85.9 fL (ref 78.0–100.0)
PLATELETS: 235 10*3/uL (ref 150–400)
RBC: 4.03 MIL/uL (ref 3.87–5.11)
RDW: 13.4 % (ref 11.5–15.5)
WBC: 11.6 10*3/uL — AB (ref 4.0–10.5)

## 2016-05-28 LAB — ABO/RH: ABO/RH(D): B POS

## 2016-05-28 LAB — RPR: RPR: NONREACTIVE

## 2016-05-28 LAB — TYPE AND SCREEN
ABO/RH(D): B POS
Antibody Screen: NEGATIVE

## 2016-05-28 SURGERY — Surgical Case
Anesthesia: Epidural

## 2016-05-28 MED ORDER — TETANUS-DIPHTH-ACELL PERTUSSIS 5-2.5-18.5 LF-MCG/0.5 IM SUSP: 0.5000 mL | Freq: Once | INTRAMUSCULAR | Status: DC

## 2016-05-28 MED ORDER — DEXAMETHASONE SODIUM PHOSPHATE 10 MG/ML IJ SOLN
INTRAMUSCULAR | Status: AC
  Filled 2016-05-28: qty 1

## 2016-05-28 MED ORDER — ONDANSETRON HCL 4 MG/2ML IJ SOLN
INTRAMUSCULAR | Status: AC
  Filled 2016-05-28: qty 2

## 2016-05-28 MED ORDER — MENTHOL 3 MG MT LOZG: 1.0000 | LOZENGE | OROMUCOSAL | Status: DC | PRN

## 2016-05-28 MED ORDER — ACETAMINOPHEN 325 MG PO TABS: 650.0000 mg | ORAL_TABLET | ORAL | Status: DC | PRN

## 2016-05-28 MED ORDER — IBUPROFEN 600 MG PO TABS
600.0000 mg | ORAL_TABLET | Freq: Four times a day (QID) | ORAL | Status: DC | PRN
  Administered 2016-05-29 – 2016-05-31 (×7): 600 mg via ORAL
  Filled 2016-05-28 (×8): qty 1

## 2016-05-28 MED ORDER — SCOPOLAMINE 1 MG/3DAYS TD PT72
MEDICATED_PATCH | TRANSDERMAL | Status: AC
  Filled 2016-05-28: qty 1

## 2016-05-28 MED ORDER — COCONUT OIL OIL
1.0000 "application " | TOPICAL_OIL | Status: DC | PRN
  Administered 2016-05-29: 1 via TOPICAL
  Filled 2016-05-28: qty 120

## 2016-05-28 MED ORDER — MEPERIDINE HCL 25 MG/ML IJ SOLN
6.2500 mg | INTRAMUSCULAR | Status: DC | PRN
  Administered 2016-05-28: 6.25 mg via INTRAVENOUS

## 2016-05-28 MED ORDER — FENTANYL CITRATE (PF) 250 MCG/5ML IJ SOLN
INTRAMUSCULAR | Status: AC
Start: 2016-05-28 — End: 2016-05-28
  Filled 2016-05-28: qty 5

## 2016-05-28 MED ORDER — OXYCODONE-ACETAMINOPHEN 5-325 MG PO TABS: 2.0000 | ORAL_TABLET | ORAL | Status: DC | PRN

## 2016-05-28 MED ORDER — TERBUTALINE SULFATE 1 MG/ML IJ SOLN
0.2500 mg | Freq: Once | INTRAMUSCULAR | Status: AC | PRN
  Administered 2016-05-28: 0.25 mg via SUBCUTANEOUS
  Filled 2016-05-28: qty 1

## 2016-05-28 MED ORDER — SUCCINYLCHOLINE CHLORIDE 20 MG/ML IJ SOLN
INTRAMUSCULAR | Status: DC | PRN
  Administered 2016-05-28: 120 mg via INTRAVENOUS

## 2016-05-28 MED ORDER — LIDOCAINE HCL (CARDIAC) 20 MG/ML IV SOLN
INTRAVENOUS | Status: AC
  Filled 2016-05-28: qty 5

## 2016-05-28 MED ORDER — OXYTOCIN 40 UNITS IN LACTATED RINGERS INFUSION - SIMPLE MED: 2.5000 [IU]/h | INTRAVENOUS | Status: AC

## 2016-05-28 MED ORDER — FENTANYL 2.5 MCG/ML BUPIVACAINE 1/10 % EPIDURAL INFUSION (WH - ANES)
14.0000 mL/h | INTRAMUSCULAR | Status: DC | PRN
  Administered 2016-05-28 (×2): 14 mL/h via EPIDURAL
  Filled 2016-05-28: qty 100

## 2016-05-28 MED ORDER — DIPHENHYDRAMINE HCL 50 MG/ML IJ SOLN: 12.5000 mg | Freq: Four times a day (QID) | INTRAMUSCULAR | Status: DC | PRN

## 2016-05-28 MED ORDER — SCOPOLAMINE 1 MG/3DAYS TD PT72
MEDICATED_PATCH | TRANSDERMAL | Status: DC | PRN
  Administered 2016-05-28: 1 via TRANSDERMAL

## 2016-05-28 MED ORDER — MIDAZOLAM HCL 2 MG/2ML IJ SOLN
INTRAMUSCULAR | Status: DC | PRN
  Administered 2016-05-28: 2 mg via INTRAVENOUS

## 2016-05-28 MED ORDER — ONDANSETRON HCL 4 MG/2ML IJ SOLN: 4.0000 mg | Freq: Four times a day (QID) | INTRAMUSCULAR | Status: DC | PRN

## 2016-05-28 MED ORDER — PRENATAL MULTIVITAMIN CH
1.0000 | ORAL_TABLET | Freq: Every day | ORAL | Status: DC
  Administered 2016-05-29 – 2016-05-30 (×2): 1 via ORAL
  Filled 2016-05-28 (×3): qty 1

## 2016-05-28 MED ORDER — LIDOCAINE HCL (CARDIAC) 20 MG/ML IV SOLN
INTRAVENOUS | Status: DC | PRN
  Administered 2016-05-28: 100 mg via INTRATRACHEAL

## 2016-05-28 MED ORDER — LIDOCAINE HCL (PF) 1 % IJ SOLN
INTRAMUSCULAR | Status: DC | PRN
  Administered 2016-05-28: 4 mL
  Administered 2016-05-28: 6 mL via EPIDURAL

## 2016-05-28 MED ORDER — DIPHENHYDRAMINE HCL 12.5 MG/5ML PO ELIX
12.5000 mg | ORAL_SOLUTION | Freq: Four times a day (QID) | ORAL | Status: DC | PRN
  Filled 2016-05-28: qty 5

## 2016-05-28 MED ORDER — NALOXONE HCL 0.4 MG/ML IJ SOLN: 0.4000 mg | INTRAMUSCULAR | Status: DC | PRN

## 2016-05-28 MED ORDER — PHENYLEPHRINE 40 MCG/ML (10ML) SYRINGE FOR IV PUSH (FOR BLOOD PRESSURE SUPPORT)
80.0000 ug | PREFILLED_SYRINGE | INTRAVENOUS | Status: DC | PRN
  Filled 2016-05-28: qty 10

## 2016-05-28 MED ORDER — WITCH HAZEL-GLYCERIN EX PADS: 1.0000 "application " | MEDICATED_PAD | CUTANEOUS | Status: DC | PRN

## 2016-05-28 MED ORDER — SENNOSIDES-DOCUSATE SODIUM 8.6-50 MG PO TABS
1.0000 | ORAL_TABLET | Freq: Every evening | ORAL | Status: DC | PRN
  Administered 2016-05-30 – 2016-05-31 (×2): 1 via ORAL
  Filled 2016-05-28 (×3): qty 1

## 2016-05-28 MED ORDER — SUCCINYLCHOLINE CHLORIDE 200 MG/10ML IV SOSY
PREFILLED_SYRINGE | INTRAVENOUS | Status: AC
  Filled 2016-05-28: qty 10

## 2016-05-28 MED ORDER — PROPOFOL 10 MG/ML IV BOLUS
INTRAVENOUS | Status: AC
  Filled 2016-05-28: qty 20

## 2016-05-28 MED ORDER — LIDOCAINE HCL (PF) 1 % IJ SOLN
30.0000 mL | INTRAMUSCULAR | Status: DC | PRN
  Filled 2016-05-28: qty 30

## 2016-05-28 MED ORDER — SIMETHICONE 80 MG PO CHEW
80.0000 mg | CHEWABLE_TABLET | Freq: Three times a day (TID) | ORAL | Status: DC
  Administered 2016-05-29 – 2016-05-31 (×7): 80 mg via ORAL
  Filled 2016-05-28 (×7): qty 1

## 2016-05-28 MED ORDER — OXYCODONE-ACETAMINOPHEN 5-325 MG PO TABS: 1.0000 | ORAL_TABLET | ORAL | Status: DC | PRN

## 2016-05-28 MED ORDER — OXYTOCIN 10 UNIT/ML IJ SOLN
INTRAMUSCULAR | Status: AC
  Filled 2016-05-28: qty 4

## 2016-05-28 MED ORDER — SOD CITRATE-CITRIC ACID 500-334 MG/5ML PO SOLN
30.0000 mL | ORAL | Status: DC | PRN
  Administered 2016-05-28: 30 mL via ORAL
  Filled 2016-05-28: qty 15

## 2016-05-28 MED ORDER — MEASLES, MUMPS & RUBELLA VAC ~~LOC~~ INJ
0.5000 mL | INJECTION | Freq: Once | SUBCUTANEOUS | Status: DC
  Filled 2016-05-28: qty 0.5

## 2016-05-28 MED ORDER — LACTATED RINGERS IV SOLN
500.0000 mL | Freq: Once | INTRAVENOUS | Status: AC
  Administered 2016-05-28: 1000 mL via INTRAVENOUS

## 2016-05-28 MED ORDER — HYDROMORPHONE HCL 1 MG/ML IJ SOLN
0.2500 mg | INTRAMUSCULAR | Status: DC | PRN
  Administered 2016-05-28 (×4): 0.5 mg via INTRAVENOUS

## 2016-05-28 MED ORDER — FENTANYL CITRATE (PF) 100 MCG/2ML IJ SOLN
100.0000 ug | INTRAMUSCULAR | Status: DC | PRN
  Administered 2016-05-28: 250 ug via INTRAVENOUS
  Administered 2016-05-28 (×4): 100 ug via INTRAVENOUS
  Filled 2016-05-28 (×3): qty 2

## 2016-05-28 MED ORDER — PROPOFOL 10 MG/ML IV BOLUS
INTRAVENOUS | Status: DC | PRN
  Administered 2016-05-28: 200 mg via INTRAVENOUS

## 2016-05-28 MED ORDER — EPHEDRINE 5 MG/ML INJ: 10.0000 mg | INTRAVENOUS | Status: DC | PRN

## 2016-05-28 MED ORDER — OXYTOCIN 10 UNIT/ML IJ SOLN
INTRAVENOUS | Status: DC | PRN
  Administered 2016-05-28: 40 [IU] via INTRAVENOUS

## 2016-05-28 MED ORDER — OXYTOCIN 40 UNITS IN LACTATED RINGERS INFUSION - SIMPLE MED: 1.0000 m[IU]/min | INTRAVENOUS | Status: DC

## 2016-05-28 MED ORDER — FLEET ENEMA 7-19 GM/118ML RE ENEM: 1.0000 | ENEMA | RECTAL | Status: DC | PRN

## 2016-05-28 MED ORDER — ROCURONIUM BROMIDE 100 MG/10ML IV SOLN
INTRAVENOUS | Status: AC
  Filled 2016-05-28: qty 1

## 2016-05-28 MED ORDER — LACTATED RINGERS IV SOLN
INTRAVENOUS | Status: DC
  Administered 2016-05-28: 12:00:00 via INTRAUTERINE
  Administered 2016-05-28: 125 mL/h via INTRAUTERINE

## 2016-05-28 MED ORDER — CEFAZOLIN SODIUM-DEXTROSE 2-3 GM-% IV SOLR
INTRAVENOUS | Status: DC | PRN
  Administered 2016-05-28: 2 g via INTRAVENOUS

## 2016-05-28 MED ORDER — MEPERIDINE HCL 25 MG/ML IJ SOLN
INTRAMUSCULAR | Status: AC
  Filled 2016-05-28: qty 1

## 2016-05-28 MED ORDER — LACTATED RINGERS IV SOLN
INTRAVENOUS | Status: DC
  Administered 2016-05-28 – 2016-05-29 (×3): via INTRAVENOUS

## 2016-05-28 MED ORDER — FENTANYL CITRATE (PF) 100 MCG/2ML IJ SOLN
INTRAMUSCULAR | Status: AC
  Filled 2016-05-28: qty 2

## 2016-05-28 MED ORDER — HYDROMORPHONE 1 MG/ML IV SOLN
INTRAVENOUS | Status: DC
  Administered 2016-05-28: 16:00:00 via INTRAVENOUS
  Administered 2016-05-28: 2.1 mg via INTRAVENOUS
  Administered 2016-05-28: 1.2 mg via INTRAVENOUS
  Administered 2016-05-29: 0.9 mg via INTRAVENOUS
  Administered 2016-05-29: 1.5 mg via INTRAVENOUS
  Filled 2016-05-28: qty 25

## 2016-05-28 MED ORDER — PHENYLEPHRINE 40 MCG/ML (10ML) SYRINGE FOR IV PUSH (FOR BLOOD PRESSURE SUPPORT): 80.0000 ug | PREFILLED_SYRINGE | INTRAVENOUS | Status: DC | PRN

## 2016-05-28 MED ORDER — SODIUM CHLORIDE 0.9 % IR SOLN
Status: DC | PRN
  Administered 2016-05-28: 1

## 2016-05-28 MED ORDER — HYDROMORPHONE HCL 1 MG/ML IJ SOLN
INTRAMUSCULAR | Status: AC
  Filled 2016-05-28: qty 1

## 2016-05-28 MED ORDER — ACETAMINOPHEN 325 MG PO TABS
650.0000 mg | ORAL_TABLET | ORAL | Status: DC | PRN
  Administered 2016-05-29: 650 mg via ORAL
  Filled 2016-05-28 (×2): qty 2

## 2016-05-28 MED ORDER — ONDANSETRON HCL 4 MG/2ML IJ SOLN
INTRAMUSCULAR | Status: DC | PRN
  Administered 2016-05-28: 4 mg via INTRAVENOUS

## 2016-05-28 MED ORDER — DIBUCAINE 1 % RE OINT: 1.0000 "application " | TOPICAL_OINTMENT | RECTAL | Status: DC | PRN

## 2016-05-28 MED ORDER — DEXAMETHASONE SODIUM PHOSPHATE 10 MG/ML IJ SOLN
INTRAMUSCULAR | Status: DC | PRN
  Administered 2016-05-28: 10 mg via INTRAVENOUS

## 2016-05-28 MED ORDER — MIDAZOLAM HCL 2 MG/2ML IJ SOLN
INTRAMUSCULAR | Status: AC
  Filled 2016-05-28: qty 2

## 2016-05-28 MED ORDER — SODIUM CHLORIDE 0.9% FLUSH: 9.0000 mL | INTRAVENOUS | Status: DC | PRN

## 2016-05-28 MED ORDER — DIPHENHYDRAMINE HCL 50 MG/ML IJ SOLN: 12.5000 mg | INTRAMUSCULAR | Status: DC | PRN

## 2016-05-28 SURGICAL SUPPLY — 36 items
BENZOIN TINCTURE PRP APPL 2/3 (GAUZE/BANDAGES/DRESSINGS) ×3 IMPLANT
CANISTER SUCT 3000ML PPV (MISCELLANEOUS) ×3 IMPLANT
CHLORAPREP W/TINT 26ML (MISCELLANEOUS) ×3 IMPLANT
CLOSURE WOUND 1/2 X4 (GAUZE/BANDAGES/DRESSINGS) ×1
DERMABOND ADVANCED (GAUZE/BANDAGES/DRESSINGS) ×2
DERMABOND ADVANCED .7 DNX12 (GAUZE/BANDAGES/DRESSINGS) ×1 IMPLANT
DRSG OPSITE POSTOP 4X10 (GAUZE/BANDAGES/DRESSINGS) ×3 IMPLANT
ELECT REM PT RETURN 9FT ADLT (ELECTROSURGICAL) ×3
ELECTRODE REM PT RTRN 9FT ADLT (ELECTROSURGICAL) ×1 IMPLANT
GLOVE BIOGEL PI IND STRL 7.0 (GLOVE) ×2 IMPLANT
GLOVE BIOGEL PI IND STRL 7.5 (GLOVE) ×1 IMPLANT
GLOVE BIOGEL PI INDICATOR 7.0 (GLOVE) ×4
GLOVE BIOGEL PI INDICATOR 7.5 (GLOVE) ×2
GLOVE SKINSENSE NS SZ7.0 (GLOVE) ×2
GLOVE SKINSENSE STRL SZ7.0 (GLOVE) ×1 IMPLANT
GOWN STRL REUS W/ TWL LRG LVL3 (GOWN DISPOSABLE) ×2 IMPLANT
GOWN STRL REUS W/ TWL XL LVL3 (GOWN DISPOSABLE) ×1 IMPLANT
GOWN STRL REUS W/TWL LRG LVL3 (GOWN DISPOSABLE) ×4
GOWN STRL REUS W/TWL XL LVL3 (GOWN DISPOSABLE) ×2
KIT ABG SYR 3ML LUER SLIP (SYRINGE) ×6 IMPLANT
NEEDLE HYPO 25X5/8 SAFETYGLIDE (NEEDLE) ×6 IMPLANT
NS IRRIG 1000ML POUR BTL (IV SOLUTION) ×3 IMPLANT
PACK C SECTION WH (CUSTOM PROCEDURE TRAY) ×3 IMPLANT
PAD ABD 7.5X8 STRL (GAUZE/BANDAGES/DRESSINGS) ×3 IMPLANT
PAD ABD 8X7 1/2 STERILE (GAUZE/BANDAGES/DRESSINGS) ×3 IMPLANT
PAD OB MATERNITY 4.3X12.25 (PERSONAL CARE ITEMS) ×3 IMPLANT
PAD PREP 24X48 CUFFED NSTRL (MISCELLANEOUS) ×3 IMPLANT
SPONGE DRAIN TRACH 4X4 STRL 2S (GAUZE/BANDAGES/DRESSINGS) ×3 IMPLANT
STRIP CLOSURE SKIN 1/2X4 (GAUZE/BANDAGES/DRESSINGS) ×2 IMPLANT
SUT MON AB 4-0 PS1 27 (SUTURE) ×3 IMPLANT
SUT MON AB-0 CT1 36 (SUTURE) ×6 IMPLANT
SUT PLAIN 2 0 (SUTURE) ×2
SUT PLAIN ABS 2-0 CT1 27XMFL (SUTURE) ×1 IMPLANT
SUT VIC AB 0 CT1 36 (SUTURE) ×6 IMPLANT
SUT VIC AB 3-0 CT1 27 (SUTURE) ×2
SUT VIC AB 3-0 CT1 TAPERPNT 27 (SUTURE) ×1 IMPLANT

## 2016-05-28 NOTE — Progress Notes (Signed)
Stat c-section called by Dr. Vergie LivingPickens

## 2016-05-28 NOTE — Anesthesia Postprocedure Evaluation (Signed)
Anesthesia Post Note  Patient: Wendy Morrison  Procedure(s) Performed: Procedure(s) (LRB): CESAREAN SECTION (N/A)  Patient location during evaluation: PACU Anesthesia Type: General Level of consciousness: sedated Pain management: satisfactory to patient Vital Signs Assessment: post-procedure vital signs reviewed and stable Respiratory status: spontaneous breathing Cardiovascular status: stable Anesthetic complications: no     Last Vitals:  Vitals:   05/28/16 1515 05/28/16 1530  BP: 133/72 121/70  Pulse: (!) 107 (!) 111  Resp: 16 18  Temp:  37.6 C    Last Pain:  Vitals:   05/28/16 1530  TempSrc:   PainSc: 7    Pain Goal:                 [name],[name] EDWARD

## 2016-05-28 NOTE — Addendum Note (Signed)
Addendum  created 05/28/16 1639 by Graciela HusbandsWynn O Fenix Ruppe, CRNA   Sign clinical note

## 2016-05-28 NOTE — Transfer of Care (Signed)
Immediate Anesthesia Transfer of Care Note  Patient: Wendy Morrison  Procedure(s) Performed: Procedure(s): CESAREAN SECTION (N/A)  Patient Location: PACU  Anesthesia Type:General and Epidural  Level of Consciousness: sedated  Airway & Oxygen Therapy: Patient Spontanous Breathing and Patient connected to nasal cannula oxygen  Post-op Assessment: Report given to RN and Post -op Vital signs reviewed and stable  Post vital signs: Reviewed and stable  Last Vitals:  Vitals:   05/28/16 1101 05/28/16 1131  BP: 121/80 (!) 95/58  Pulse: (!) 107 87  Resp: 18   Temp: 37.1 C     Last Pain:  Vitals:   05/28/16 1131  TempSrc:   PainSc: 2          Complications: No apparent anesthesia complications

## 2016-05-28 NOTE — Anesthesia Preprocedure Evaluation (Signed)

## 2016-05-28 NOTE — Lactation Note (Addendum)
This note was copied from a baby's chart. Lactation Consultation Note  Patient Name: Wendy Morrison ZOXWR'UToday's Date: 05/28/2016 Reason for consult: Initial assessment  Initial visit attempted at 9 hours of life. Mom is a P1, s/p C-Section (EBL not yet noted in chart). Accompanied by the interpreter, Mom stated that she was very tired. Then, she commented that she was feeling dizzy (despite already lying down) & her hold on the baby looked less secure. I took baby from Mom's arms, requested RN assistance, & lowered the head of the bed some. RN arrived. Baby placed in bassinet.   Brochure was left in room, but there was no opportunity to talk to parents at this time. Mom has hypothyroidism.   Lurline HareRichey, Wendy Morrison [facility] 05/28/2016, 9:41 PM

## 2016-05-28 NOTE — H&P (Signed)
LABOR AND DELIVERY ADMISSION HISTORY AND PHYSICAL NOTE  Wendy Morrison is a 20 y.o. female G2P0010 with IUP at 3115w6d by U/S presenting for SOL.  CTX began around 2AM yesterday.  States her mucous plug came out.   Frequency of contractions is Q10-15 min.  Denies vaginal bleeding, leakage of fluids.  She reports positive fetal movement.  Prenatal History/Complications:  Breech presentation at 37 weeks, successfully externally rotated   Past Medical History: Past Medical History:  Diagnosis Date  . Hypothyroidism    Past Surgical History: Past Surgical History:  Procedure Laterality Date  . NO PAST SURGERIES     Obstetrical History: OB History    Gravida Para Term Preterm AB Living   2       1     SAB TAB Ectopic Multiple Live Births                 Social History: Social History   Social History  . Marital status: Single    Spouse name: N/A  . Number of children: N/A  . Years of education: N/A   Social History Main Topics  . Smoking status: Never Smoker  . Smokeless tobacco: Never Used  . Alcohol use No  . Drug use: No  . Sexual activity: Yes    Birth control/ protection: None   Other Topics Concern  . None   Social History Narrative  . None   Family History: Family History  Problem Relation Age of Onset  . Cancer Maternal Grandmother   . Diabetes Mother   . Hypertension Mother   . Diabetes Father    Allergies: Allergies  Allergen Reactions  . Penicillins Itching and Rash    Has patient had a PCN reaction causing immediate rash, facial/tongue/throat swelling, SOB or lightheadedness with hypotension: yes Has patient had a PCN reaction causing severe rash involving mucus membranes or skin necrosis: no Has patient had a PCN reaction that required hospitalization no Has patient had a PCN reaction occurring within the last 10 years: yes If all of the above answers are "NO", then may proceed with Cephalosporin use.    Prescriptions Prior to Admission   Medication Sig Dispense Refill Last Dose  . docusate sodium (COLACE) 100 MG capsule Take 1 capsule (100 mg total) by mouth 2 (two) times daily as needed for mild constipation. 30 capsule 0 05/26/2016 at Unknown time  . Prenatal Vit-Fe Fumarate-FA (PRENATAL MULTIVITAMIN) TABS tablet Take 1 tablet by mouth daily at 12 noon.   Past Week at Unknown time   Review of Systems   All systems reviewed and negative except as stated in HPI  Blood pressure 113/73, pulse 106, temperature 98 F (36.7 C), temperature source Oral, resp. rate 18, height 5\' 4"  (1.626 m), weight 174 lb (78.9 kg), last menstrual period 08/28/2015, SpO2 98 %. General appearance: alert, cooperative and appears stated age Lungs: clear to auscultation bilaterally Heart: regular rate and rhythm Abdomen: soft, non-tender; bowel sounds normal Extremities: No calf swelling or tenderness Presentation: cephalic Fetal monitoring: Baseline 140s, moderate variability, +accels, no decels.  Uterine activity: Q 4-5 min Dilation: 5 Effacement (%): 90 Station: -3 Exam by:: Kayren EavesAshley Garvey RN   Prenatal labs: ABO, Rh: B/POS/-- (06/05 1048) Antibody: NEG (06/05 1048) Rubella: Immune RPR: NON REAC (09/08 1509)  HBsAg: NEGATIVE (06/05 1048)  HIV: NONREACTIVE (09/08 1509)  GBS: Negative (11/22 0758)  1 hr Glucola: wnl Genetic screening:  Neg Anatomy US: Normal  Prenatal Transfer Tool  Maternal Diabetes: No  Genetic Screening: Normal Maternal Ultrasounds/Referrals: Normal Fetal Ultrasounds or other Referrals:  None Maternal Substance Abuse:  No Significant Maternal Medications:  None Significant Maternal Lab Results: None  Results for orders placed or performed during the hospital encounter of 05/27/16 (from the past 24 hour(s))  CBC   Collection Time: 05/27/16 11:50 PM  Result Value Ref Range   WBC 11.6 (H) 4.0 - 10.5 K/uL   RBC 4.03 3.87 - 5.11 MIL/uL   Hemoglobin 11.7 (L) 12.0 - 15.0 g/dL   HCT 16.134.6 (L) 09.636.0 - 04.546.0 %   MCV  85.9 78.0 - 100.0 fL   MCH 29.0 26.0 - 34.0 pg   MCHC 33.8 30.0 - 36.0 g/dL   RDW 40.913.4 81.111.5 - 91.415.5 %   Platelets 235 150 - 400 K/uL    Patient Active Problem List   Diagnosis Date Noted  . Indication for care in labor or delivery 05/28/2016  . Breech presentation 05/13/2016  . Rubella non-immune status, antepartum 04/02/2016  . Lower back pain 02/20/2016  . Encounter for supervision of other normal pregnancy 01/15/2016  . Hypothyroidism 12/10/2015  . Dizziness 12/10/2015  . Nausea with vomiting 12/03/2015    Assessment: Wendy Morrison is a 20 y.o. G2P0010 at 7983w6d here for SOL.    #Labor:  Anticipate NSVD.   #Pain: Epidural on request.  #FWB: Cat 1 tracing #ID:  GBS Neg #MOF: breast #MOC: Nexplanon #Circ:  N/A  Freddrick MarchYashika Amin, MD PGY-1 05/28/2016, 12:15 AM    OB FELLOW HISTORY AND PHYSICAL ATTESTATION  I have seen and examined this patient; I agree with above documentation in the resident's note.    Ernestina Pennaicholas Geovannie Vilar 05/28/2016, 12:56 AM

## 2016-05-28 NOTE — Anesthesia Procedure Notes (Signed)

## 2016-05-28 NOTE — Progress Notes (Signed)
Patient seen Doing well. Category 1 tracing. AROM for moderate mec. 8/90/-1. Pateint requesting IV pain medication. Will give at this time.

## 2016-05-28 NOTE — Anesthesia Procedure Notes (Signed)
Procedure Name: Intubation Date/Time: 05/28/2016 12:02 PM Performed by: Junious SilkGILBERT, Mikhala Kenan Pre-anesthesia Checklist: Patient identified, Emergency Drugs available, Suction available, Patient being monitored and Timeout performed Patient Re-evaluated:Patient Re-evaluated prior to inductionOxygen Delivery Method: Circle system utilized Preoxygenation: Pre-oxygenation with 100% oxygen Intubation Type: IV induction, Rapid sequence and Cricoid Pressure applied Laryngoscope Size: Glidescope and 3 Grade View: Grade I Tube type: Oral Tube size: 7.0 mm Number of attempts: 1 Airway Equipment and Method: Video-laryngoscopy and Stylet Placement Confirmation: ETT inserted through vocal cords under direct vision,  positive ETCO2,  CO2 detector and breath sounds checked- equal and bilateral Secured at: 21 cm Tube secured with: Tape Dental Injury: Teeth and Oropharynx as per pre-operative assessment

## 2016-05-28 NOTE — Progress Notes (Signed)
Patient is 9 cm and is unchanged since 7 am, has been having occasional variables with moderate variability. IUPC in place and FSE in place. Plan to start low dose pitocin at 11 with amnioinfusion and reassess in 1 hour.

## 2016-05-28 NOTE — Op Note (Signed)
Operative Note   SURGERY DATE: 05/28/2016  PRE-OP DIAGNOSIS:  *Pregnancy @ 40/6 *Fetal bradycardia x 10 minutes *Moderate meconium *Arrest of dilation  POST-OP DIAGNOSIS: Same. Delivered   PROCEDURE: Urgent primary low transverse cesarean section via Claretha CooperJoel Cohen method with double layer uterine closure  SURGEON: Surgeon(s) and Role:    * [place] Bingharlie Marino Rogerson, MD - Primary  ASSISTANT: Deforest HoylesNoah Wallace, DO  ANESTHESIA: epidural-->general  ESTIMATED BLOOD LOSS: 600mL  DRAINS: 550mL UOP via indwelling foley  TOTAL IV FLUIDS: 2300mL crystalloid  VTE PROPHYLAXIS: SCDs to bilateral lower extremities  ANTIBIOTICS: Two grams of Cefazolin were given., within 1 hour of skin incision. Patient states she had rash with PCN but no type 1 signs or symptoms with last use when she was a child.   SPECIMENS: placenta to pathology, arterial and venous cord gases  COMPLICATIONS: none  INDICATIONS: Patient 9cm x approximately 5 hours and unable to start pitocin due to fetal intolerance. One hour rest since last episode of NRFHT and fetus with variables and bradycardia unresponsive to to LLR positioning and IVF bolus. Terbutaline given and by the time we were back in the OR the fetus had a normal FHR so stat was called off and urgent c-section done for arrest of dilation  FINDINGS: No intra-abdominal adhesions were noted. Grossly normal uterus, tubes and ovaries. Moderate meconium stained amniotic fluid, cephalic female infant, weight 3315gm, APGARs 7/9, intact placenta. Cord gases   Ref. Range 05/28/2016 12:12 05/28/2016 12:12  pH cord blood (arterial) Latest Ref Range: 7.210 - 7.380   7.214  pCO2 cord blood (arterial) Latest Ref Range: 42.0 - 56.0 mmHg  53.4  Bicarbonate Latest Ref Range: 13.0 - 22.0 mmol/L 21.3 20.8  Ph Cord Blood (Venous) Latest Ref Range: 7.240 - 7.380  7.296   pCO2 Cord Blood (Venous) Latest Ref Range: 42.0 - 56.0  45.2     PROCEDURE IN DETAIL: The patient was taken to the operating  room where anesthesia was administered after epidural re-dosing unsuccessful, after prepping and draping her and  a time out was performed. A Claretha CooperJoel Cohen incision was made with the scalpel and carried through to the underlying layer of fascia. The fascia was then incised at the midline and this incision was extended bluntly and the peritoneum then entered bluntly. The bladder blade was inserted and the vesicouterine peritoneum was identified and a low transverse hysterotomy was made with the scalpel until the endometrial cavity was breached and the amniotic sac ruptured yielding meconium stained amniotic fluid. This incision was extended bluntly and the infant's head, shoulders and body were delivered atraumatically.The cord was clamped x 2 and cut, and the infant was handed to the awaiting pediatricians, after delayed cord clamping was not done due to meconium.  The placenta was then gradually expressed from the uterus and then the uterus was exteriorized and cleared of all clots and debris. The hysterotomy was repaired with a running suture of 1-0 monocryl. A second imbricating layer of 1-0 monocryl suture was then placed to achieve excellent hemostasis.   The uterus and adnexa were then returned to the abdomen, and the hysterotomy and all operative sites were reinspected and excellent hemostasis was noted after irrigation and suction of the abdomen with warm saline.  The peritoneum was closed with a running stitch of 3-0 Vicryl. The fascia was reapproximated with 0 Vicryl in a simple running fashion bilaterally. The subcutaneous layer was then reapproximated with interrupted sutures of 2-0 plain gut, and the skin was then closed with  4-0 monocryl, in a subcuticular fashion.  The patient  tolerated the procedure well. Sponge, lap, needle, and instrument counts were correct x 2. The patient was transferred to the recovery room awake, alert and breathing independently in stable condition.  Cornelia Copaharlie Marchell Froman,  Jr. MD Attending Center [facility](Faculty Practice)

## 2016-05-28 NOTE — Anesthesia Postprocedure Evaluation (Signed)
Anesthesia Post Note  Patient: Wendy Morrison  Procedure(s) Performed: Procedure(s) (LRB): CESAREAN SECTION (N/A)  Patient location during evaluation: Mother Baby Anesthesia Type: General and Epidural Level of consciousness: awake and alert and oriented Pain management: pain level controlled Vital Signs Assessment: post-procedure vital signs reviewed and stable Respiratory status: spontaneous breathing, respiratory function stable and patient connected to nasal cannula oxygen Cardiovascular status: stable Postop Assessment: adequate PO intake, patient able to bend at knees, epidural receding and no signs of nausea or vomiting Anesthetic complications: no     Last Vitals:  Vitals:   05/28/16 1530 05/28/16 1600  BP: 121/70   Pulse: (!) 111   Resp: 18 20  Temp: 37.6 C     Last Pain:  Vitals:   05/28/16 1600  TempSrc:   PainSc: 8    Pain Goal:                 Wendy Morrison

## 2016-05-29 LAB — CBC
HCT: 27.1 % — ABNORMAL LOW (ref 36.0–46.0)
Hemoglobin: 9.5 g/dL — ABNORMAL LOW (ref 12.0–15.0)
MCH: 30.1 pg (ref 26.0–34.0)
MCHC: 35.1 g/dL (ref 30.0–36.0)
MCV: 85.8 fL (ref 78.0–100.0)
PLATELETS: 171 10*3/uL (ref 150–400)
RBC: 3.16 MIL/uL — AB (ref 3.87–5.11)
RDW: 13.4 % (ref 11.5–15.5)
WBC: 12.5 10*3/uL — AB (ref 4.0–10.5)

## 2016-05-29 MED ORDER — OXYCODONE-ACETAMINOPHEN 5-325 MG PO TABS
1.0000 | ORAL_TABLET | ORAL | Status: DC | PRN
  Administered 2016-05-29 (×2): 2 via ORAL
  Administered 2016-05-30: 1 via ORAL
  Administered 2016-05-30 (×2): 2 via ORAL
  Administered 2016-05-30 – 2016-05-31 (×2): 1 via ORAL
  Filled 2016-05-29: qty 2
  Filled 2016-05-29: qty 1
  Filled 2016-05-29 (×2): qty 2
  Filled 2016-05-29: qty 1
  Filled 2016-05-29: qty 2
  Filled 2016-05-29: qty 1

## 2016-05-29 NOTE — Progress Notes (Signed)
Subjective: Postpartum Day 1: Cesarean Delivery Patient reports incisional pain and tolerating PO.   No flatus, N/V Pain minimal  Objective: Vital signs in last 24 hours: Temp:  [98.5 F (36.9 C)-99.7 F (37.6 C)] 99.2 F (37.3 C) (12/08 0130) Pulse Rate:  [78-135] 84 (12/08 0530) Resp:  [13-20] 20 (12/08 1120) BP: (98-150)/(54-106) 107/58 (12/08 0530) SpO2:  [95 %-100 %] 100 % (12/08 1120) FiO2 (%):  [99 %] 99 % (12/07 1630)  Physical Exam:  General: alert, cooperative and no distress Heart: RRR Lungs: CTA bilat Lochia: appropriate Uterine Fundus: firm Incision: healing well, no significant drainage, no dehiscence, no significant erythema, pressure dsg in place c/d/i DVT Evaluation: No evidence of DVT seen on physical exam. Negative Homan's sign. No cords or calf tenderness. No significant calf/ankle edema.   Recent Labs  05/27/16 2350 05/29/16 0532  HGB 11.7* 9.5*  HCT 34.6* 27.1*    Assessment/Plan: Status post Cesarean section. Doing well postoperatively.  Continue current care Discontinue foley and PCA Percocet prn  Donette LarryMelanie Jailyn Langhorst, CNM 05/29/2016, 12:43 PM

## 2016-05-29 NOTE — Lactation Note (Signed)
This note was copied from a baby's chart. Lactation Consultation Note  Interpreter present for Spanish.  Baby sleeping beside mother after having bf on L side. Mother having difficulty latching on R side.  Provided her w/ hand pump to prepump before latching on R. Mother states RN has helped spoon feed baby after hand exp on R side. Encouraged mother to call for assistance if needed w/ next feedding. Mom encouraged to feed baby 8-12 times/24 hours and with feeding cues.    Patient Name: Wendy Manson PasseySofia Tirado-Santos ZOXWR'UToday's Date: 05/29/2016     Maternal Data    Feeding Feeding Type: Breast Fed Length of feed: 20 min  LATCH Score/Interventions                      Lactation Tools Discussed/Used     Consult Status      Hardie PulleyBerkelhammer, Ruth Boschen 05/29/2016, 9:15 AM

## 2016-05-29 NOTE — Progress Notes (Signed)
Post-Op Day 1, emergency CS  Subjective: No complaints, up ad lib, voiding and tolerating PO, passing flatus,small lochia,.plans to breastfeed, plans to bottle feed, Nexplanon for BC  Objective: Blood pressure (!) 107/58, pulse 84, temperature 99.2 F (37.3 C), temperature source Oral, resp. rate 18, height 5\' 3"  (1.6 m), weight 81.6 kg (180 lb), last menstrual period 08/28/2015, SpO2 96 %, unknown if currently breastfeeding.  Physical Exam:  General: alert, cooperative and no distress Lochia:normal flow Chest: CTAB Heart: RRR no m/r/g Abdomen: +BS, soft, nontender, dsg dry/intact Uterine Fundus: firm DVT Evaluation: No evidence of DVT seen on physical exam. Extremities: trace edema   Recent Labs  05/27/16 2350 05/29/16 0532  HGB 11.7* 9.5*  HCT 34.6* 27.1*    Assessment/Plan: Breastfeeding and Lactation consult   LOS: 1 day   CRESENZO-DISHMAN,Blenda Wisecup 05/29/2016, 7:45 AM

## 2016-05-29 NOTE — Progress Notes (Signed)
Patient stood at bedside for 5 min and took two steps at 2330. RN advised patient to get OOB again and patient refused. RN attempted to get patient up to ambulate at 0530 and patient still refused. Patient is drowsy, but easy to arouse. Asleep upon arrival to the room, but rates pain a 9 when awoken. Will report to dayshift and continue to monitor. Royston CowperIsley, Draylon Mercadel E, RN

## 2016-05-29 NOTE — Progress Notes (Signed)
UR chart review completed.  

## 2016-05-29 NOTE — Progress Notes (Signed)
Encouraged patient to ambulate and have foley catheter removed, but patient is refusing to ambulate and get foley out.  Informed patient of risks of infection with leaving catheter in, and not ambulating.  Patient also states "I don't want to get this IV pain medicine taken away."  Informed patient that we would switch her to oral pain medication and she wouldn't be without medicine, it would be taken orally not intravenously. Called MD and informed of patient's refusal and stated they would come see her.

## 2016-05-30 NOTE — Progress Notes (Signed)
Mother to stay tonight as a patient due to infant not being discharged home today and to work on breastfeeding. Braun Rocca D

## 2016-05-30 NOTE — Progress Notes (Signed)
POSTOPERATIVE DAY # 2 S/P PLTCS for NRFHR   S:         Reports feeling well             Tolerating po intake / no nausea / no vomiting / positive  flatus / no BM             Bleeding is light             Pain controlled withnarcotic analgesics including oxycodone/acetaminophen (Percocet, Tylox)             Up ad lib / ambulatory/ voiding QS  Newborn breastfeeding    Working with lactation breastfeeding, currently engorged  O:  VS: BP (!) 97/51 (BP Location: Left Arm)   Pulse 79   Temp 98.3 F (36.8 C) (Oral)   Resp 18   Ht 5\' 3"  (1.6 m)   Wt 180 lb (81.6 kg)   LMP 08/28/2015 (Approximate) Comment: Abortion in January 2017, menstrual bleeding between January 21st and March 8th  SpO2 100%   Breastfeeding? Unknown   BMI 31.89 kg/m    LABS:               Recent Labs  05/27/16 2350 05/29/16 0532  WBC 11.6* 12.5*  HGB 11.7* 9.5*  PLT 235 171               Bloodtype: --/--/B POS, B POS (12/06 2350)  Rubella: <0.90 (06/05 1048)                     EBL: 600                         I&O: Intake/Output      12/08 0701 - 12/09 0700 12/09 0701 - 12/10 0700   I.V. (mL/kg) 2052.1 (25.1)    Total Intake(mL/kg) 2052.1 (25.1)    Urine (mL/kg/hr) 2300 (1.2)    Blood     Total Output 2300     Net -247.9                       Physical Exam:             Alert and Oriented X3  Lungs: Clear and unlabored  Heart: regular rate and rhythm / no mumurs  Abdomen: soft, non-tender, non-distended              Fundus: firm, non-tender, U-4             Dressing CDI              Incision:  UTA   Lochia: mild  Extremities: no dema, no calf pain or tenderness    Medications given in labor: pitocin  Complications with labor and delivery: NRFHR  [facility]  A:        POD # 2 SP pLTCS              P:        Routine postoperative care              [facility]  Special instructions/meds None  Anticipate discharge on 12/9 in the evening or 12/10  am    Wendy Morrison CNM, MSN, 05/30/2016, 7:49 AM

## 2016-05-30 NOTE — Discharge Summary (Signed)
    OB Discharge Summary     Patient Name: Wendy PasseySofia Tirado-Santos DOB: 1995/12/08 MRN: [id number]  Date of admission: 05/27/2016 Delivering MD: [place] BingPICKENS, CHARLIE   Date of discharge: 05/30/2016  Admitting diagnosis: 40w ctx 10 min, pressure, pain Intrauterine pregnancy: 1724w6d     Secondary diagnosis:  Active Problems:   Indication for care in labor or delivery  Additional problems: none     Discharge diagnosis: Term Pregnancy Delivered                                                                                                Post partum procedures:none  Augmentation: Pitocin  Complications: None  Hospital course:  Onset of Labor With Unplanned C/S  20 y.o. yo G2P1011 at 7524w6d was admitted in Active Labor on 05/27/2016. Patient had a labor course significant for NRFHR. . Membrane Rupture Time/Date: 5:06 AM ,05/28/2016   The patient went for cesarean section due to Non-Reassuring FHR, and delivered a Non Viable infant,05/28/2016  Details of operation can be found in separate operative note. Patient had an uncomplicated postpartum course.  She is ambulating,tolerating a regular diet, passing flatus, and urinating well.  Patient is discharged home in stable condition 05/30/16.   Physical exam  Vitals:   05/29/16 1000 05/29/16 1120 05/29/16 1813 05/30/16 0639  BP:   (!) 103/55 (!) 97/51  Pulse:   86 79  Resp: 17 20 18 18   Temp:    98.3 F (36.8 C)  TempSrc:    Oral  SpO2: 98% 100%    Weight:      Height:       General: alert, cooperative and no distress Lochia: appropriate Uterine Fundus: firm Incision: Dressing is clean, dry, and intact DVT Evaluation: No evidence of DVT seen on physical exam. Labs: Lab Results  Component Value Date   WBC 12.5 (H) 05/29/2016   HGB 9.5 (L) 05/29/2016   HCT 27.1 (L) 05/29/2016   MCV 85.8 05/29/2016   PLT 171 05/29/2016   CMP Latest Ref Rng & Units 12/10/2015  Glucose 65 - 99 mg/dL 72  BUN 7 - 25 mg/dL 7  Creatinine 1.320.50 - 4.401.10 mg/dL  1.02(V0.46(L)  Sodium 253135 - 146 mmol/L 137  Potassium 3.5 - 5.3 mmol/L 3.7  Chloride 98 - 110 mmol/L 104  CO2 20 - 31 mmol/L 22  Calcium 8.6 - 10.2 mg/dL 8.8    Discharge instruction: per After Visit Summary and "Baby and Me Booklet".  After visit meds:    Medication List    STOP taking these medications   docusate sodium 100 MG capsule Commonly known as:  COLACE   prenatal multivitamin Tabs tablet       Diet: routine diet  Activity: Advance as tolerated. Pelvic rest for 6 weeks.   Outpatient follow up:6 weeks Follow up Appt:No future appointments. Follow up Visit:No Follow-up on file.  Postpartum contraception: Nexplanon  Newborn Data: Live born female  Birth Weight: 7 lb 4.9 oz (3315 g) APGAR: 7, 9  Baby Feeding: Breast Disposition:home with mother   05/30/2016 Marylene LandKathryn Lorraine Carys Malina, CNM

## 2016-05-30 NOTE — Lactation Note (Signed)
This note was copied from a baby's chart. Lactation Consultation Note  Patient Name: Wendy Morrison ZOXWR'UToday's Date: 05/30/2016 Reason for consult: Follow-up assessment;Breast/nipple pain;Other (Comment) (engorged . instructed to ice for 15 -20 mins and then to pump both breast for 15 -20 mins ) MBU  RN Ashok CordiaMarissa Mabe has been working with mom with the engorgement all morning per report from her.  LC assessed breast tissue and noted bilateral engorgement  And right areola semi compressible and left non - compressible. Per dad baby had recently fed for 30 mins.  3 ice reusable ice packs provided for mom for lateral aspects of breast and on top, and the head of the bed decreased to 30 degrees. LC encouraged mom to take a nap while pumping the MBU  RN aware to come back and check mom in 30 mins. For pumping. Also recommended coconut oil to breast and to help the breast relax for better let down.    Maternal Data Has patient been taught Hand Expression?: Yes (LC encouraged mom to apply to her nipples liberally )  Feeding Feeding Type: Breast Fed Nipple Type: Slow - flow Length of feed: 30 min (per dad )  LATCH Score/Interventions          Problem noted: Engorgment Intervention(s): Ice     Intervention(s): Breastfeeding basics reviewed     Lactation Tools Discussed/Used Tools: Other (comment) (3 reuseable ice packs provided ) Breast pump type: Double-Electric Breast Pump   Consult Status Consult Status: Follow-up Date: 05/30/16 Follow-up type: In-patient    Wendy Morrison 05/30/2016, 2:29 PM

## 2016-05-31 MED ORDER — IBUPROFEN 600 MG PO TABS: 600.0000 mg | ORAL_TABLET | Freq: Four times a day (QID) | ORAL | 0 refills | Status: DC | PRN

## 2016-05-31 MED ORDER — OXYCODONE-ACETAMINOPHEN 5-325 MG PO TABS: 1.0000 | ORAL_TABLET | ORAL | 0 refills | Status: DC | PRN

## 2016-05-31 NOTE — Discharge Summary (Signed)
OB Discharge Summary  Patient Name: Wendy Morrison DOB: Mar 22, 1996 MRN: [id number]  Date of admission: 05/27/2016 Delivering MD: [place] BingPICKENS, CHARLIE   Date of discharge: 05/31/2016  Admitting diagnosis: 40w ctx 10 min, pressure, pain Intrauterine pregnancy: 2774w6d     Secondary diagnosis:Active Problems:   Indication for care in labor or delivery  Additional problems:none     Discharge diagnosis: Term Pregnancy Delivered                                                                     Post partum procedures:none  Augmentation: n/a  Complications: None  Hospital course:  Sceduled C/S   20 y.o. yo G2P1011 at 6974w6d was admitted to the hospital 05/27/2016 for scheduled cesarean section with the following indication:Elective Repeat.  Membrane Rupture Time/Date: 5:06 AM ,05/28/2016   Patient delivered a Viable infant.05/28/2016  Details of operation can be found in separate operative note.  Pateint had an uncomplicated postpartum course.  She is ambulating, tolerating a regular diet, passing flatus, and urinating well. Patient is discharged home in stable condition on  05/31/16          Physical exam Vitals:   05/29/16 1813 05/30/16 0639 05/30/16 1847 05/31/16 0611  BP: (!) 103/55 (!) 97/51 (!) 102/58 106/63  Pulse: 86 79 71 83  Resp: 18 18 18 18   Temp:  98.3 F (36.8 C) 98 F (36.7 C) 98.2 F (36.8 C)  TempSrc:  Oral Oral Oral  SpO2:      Weight:      Height:       General: alert, cooperative and no distress Lochia: appropriate Uterine Fundus: firm Incision: Healing well with no significant drainage, No significant erythema, Dressing is clean, dry, and intact DVT Evaluation: No evidence of DVT seen on physical exam. Labs: Lab Results  Component Value Date   WBC 12.5 (H) 05/29/2016   HGB 9.5 (L) 05/29/2016   HCT 27.1 (L) 05/29/2016   MCV 85.8 05/29/2016   PLT 171 05/29/2016   CMP Latest Ref Rng & Units 12/10/2015  Glucose 65 - 99 mg/dL 72  BUN 7 - 25  mg/dL 7  Creatinine 4.090.50 - 8.111.10 mg/dL 9.14(N0.46(L)  Sodium 829135 - 562146 mmol/L 137  Potassium 3.5 - 5.3 mmol/L 3.7  Chloride 98 - 110 mmol/L 104  CO2 20 - 31 mmol/L 22  Calcium 8.6 - 10.2 mg/dL 8.8    Discharge instruction: per After Visit Summary and "Baby and Me Booklet".  After Visit Meds:    Medication List    STOP taking these medications   docusate sodium 100 MG capsule Commonly known as:  COLACE   prenatal multivitamin Tabs tablet     TAKE these medications   ibuprofen 600 MG tablet Commonly known as:  ADVIL,MOTRIN Take 1 tablet (600 mg total) by mouth every 6 (six) hours as needed for headache, mild pain or cramping.   oxyCODONE-acetaminophen 5-325 MG tablet Commonly known as:  PERCOCET/ROXICET Take 1-2 tablets by mouth every 4 (four) hours as needed for severe pain.       Diet: routine diet  Activity: Advance as tolerated. Pelvic rest for 6 weeks.   Outpatient follow up:6 weeks Follow up Appt:No future appointments. Follow up visit: No  Follow-up on file.  Postpartum contraception: Undecided  Newborn Data: Live born female  Birth Weight: 7 lb 4.9 oz (3315 g) APGAR: 7, 9  Baby Feeding: Breast Disposition:home with mother   05/31/2016 Wendy Morrison, CNM

## 2016-06-01 ENCOUNTER — Other Ambulatory Visit: Payer: Self-pay | Admitting: Internal Medicine

## 2016-06-01 DIAGNOSIS — Z3483 Encounter for supervision of other normal pregnancy, third trimester: Secondary | ICD-10-CM

## 2016-07-10 ENCOUNTER — Ambulatory Visit: Payer: Self-pay | Admitting: Internal Medicine

## 2016-07-14 ENCOUNTER — Encounter: Payer: Self-pay | Admitting: Family Medicine

## 2016-07-14 ENCOUNTER — Ambulatory Visit (INDEPENDENT_AMBULATORY_CARE_PROVIDER_SITE_OTHER): Payer: Self-pay | Admitting: Family Medicine

## 2016-07-14 DIAGNOSIS — Z98891 History of uterine scar from previous surgery: Secondary | ICD-10-CM

## 2016-07-14 NOTE — Patient Instructions (Signed)
Start using MiraLax daily. If you note BMs still have a small amount of red, please follow up with us. If it is every worsening instead of improving, follow up with us as soon as possible.  Contact a health care provider if:  You feel sad or depressed.  You have thoughts of hurting yourself.  You are having trouble eating or sleeping.  You cannot enjoy the things in life you have previously enjoyed.  You are passing large clots from your vagina. Save any clots to show your health care provider.  You have a bad smelling discharge from your vagina.  You have trouble urinating.  You are urinating frequently.  You have pain when you urinate.  You have a change in your bowel movements.  You have increasing redness, pain, or swelling near your incision or vaginal tear.  You have pus draining from your incision or vaginal tear.  You have painful, hard, or reddened breasts.  You have a severe headache.  You have blurred vision or see spots.  You are dizzy or light-headed.  You have a rash.  You have nausea or vomiting.  You have not had a menstrual period by the 12th week after delivery.  You have a fever. Get help right away if:  You are concerned that you may hurt yourself or you are considering suicide.  You have persistent pain.  You have chest pain.  You have shortness of breath.  You faint.  You have leg pain.  You have stomach pain.

## 2016-07-14 NOTE — Progress Notes (Signed)
    Subjective: CC: f/u c-section Spanish interpreter Rod [phone] utilized HPI: Patient is a 21 y.o. female presenting to clinic today for f/u on c-section from 05/28/16.  Patient was dilated to 9cm for approximately 5 hours and they were unable to start pitocin due to fetal intolerance. After one hour rest the fetus still had variables and bradycardia unresponsive to to LLR positioning and IVF bolus leading to STAT c-section.   Patient has been doing well since her c-section. No pain, drainage, or warmth at the incision site. No fevers/chills. She is breastfeeding. Patient's mood is good. She feels she's bonding well with the baby. She notes her boyfriend/FOB is very supportive. No chest pain, SOB, cough, or LE swelling. No visual changes. Patient is unsure if she received MMR vaccine prior to discharge and it is difficult to tell from notes.   Patient has BMs twice per week which is unusual for her. She's taking colace daily and MiraLax daily. BMs are soft and she does not strain. She did note some blood in her stools prior to this with constipation. She would like the Nexplanon for contraception but will touch base with [employer] to see if it is covered and will touch base with our clinic after that.   Social History: non-smoker  Flu Vaccine: already received.   ROS: All other systems reviewed and are negative.  Past Medical History Patient Active Problem List   Diagnosis Date Noted  . S/P emergency C-section 07/15/2016  . Indication for care in labor or delivery 05/28/2016  . Rubella non-immune status, antepartum 04/02/2016  . Lower back pain 02/20/2016  . Encounter for supervision of other normal pregnancy 01/15/2016  . Hypothyroidism 12/10/2015  . Dizziness 12/10/2015  . Nausea with vomiting 12/03/2015    Medications- reviewed and updated  Objective: Office vital signs reviewed. BP 106/64 (BP Location: Right Arm, Patient Position: Sitting, Cuff Size: Normal)   Pulse 75    Temp 98 F (36.7 C) (Oral)   Ht '5\' 3"'$  (1.6 m)   Wt 155 lb 6.4 oz (70.5 kg)   SpO2 97%   Breastfeeding? Yes   BMI 27.53 kg/m    Physical Examination:  General: Awake, alert, well- nourished, NAD, pleasant Cardio: RRR, no m/r/g noted. No thrills. No LE edema.  Pulm: No increased WOB.  CTAB, without wheezes, rhonchi or crackles noted.  GI: soft, NT/ND,+BS x4, no hepatomegaly, no splenomegaly Skin: dry, intact, no rashes or lesions. Low transverse c-section scar well healed with good approximation without surrounding erythema .   Assessment/Plan: S/P emergency C-section Patient is presenting for post partum visit s/p c-section 6 weeks ago. She's been doing well. Surgical site well healed without pain. No evidence of volume overload on exam/history concerning for PP cardiomyopathy. No signs/symptoms of post-partum pre-eclampsia or postpartum depression. - advised pt to continue prenatal vitamins while breastfeeding. - advised pt on back up contraception, she will contact us about possibly inserting Nexplanon; will hold off on any pharmacologic intervention for right now. - pt to continue colace and MiraLax PRN in addition to good hydration and fiber intake to help with constipation. F/u if blood in stool returns despite normal BMs.  - discussed return precautions.    No orders of the defined types were placed in this encounter.   No orders of the defined types were placed in this encounter.   Archie Patten PGY-3, [place]

## 2016-07-15 DIAGNOSIS — Z98891 History of uterine scar from previous surgery: Secondary | ICD-10-CM | POA: Insufficient documentation

## 2016-07-15 NOTE — Assessment & Plan Note (Signed)
Patient is presenting for post partum visit s/p c-section 6 weeks ago. She's been doing well. Surgical site well healed without pain. No evidence of volume overload on exam/history concerning for PP cardiomyopathy. No signs/symptoms of post-partum pre-eclampsia or postpartum depression. - advised pt to continue prenatal vitamins while breastfeeding. - advised pt on back up contraception, she will contact us about possibly inserting Nexplanon; will hold off on any pharmacologic intervention for right now. - pt to continue colace and MiraLax PRN in addition to good hydration and fiber intake to help with constipation. F/u if blood in stool returns despite normal BMs.  - discussed return precautions.

## 2016-11-13 ENCOUNTER — Encounter: Payer: Self-pay | Admitting: Obstetrics & Gynecology

## 2016-11-13 ENCOUNTER — Ambulatory Visit (INDEPENDENT_AMBULATORY_CARE_PROVIDER_SITE_OTHER): Payer: Managed Care, Other (non HMO) | Admitting: Obstetrics & Gynecology

## 2016-11-13 VITALS — BP 116/74 | Ht 64.0 in | Wt 170.0 lb

## 2016-11-13 DIAGNOSIS — Z308 Encounter for other contraceptive management: Secondary | ICD-10-CM | POA: Diagnosis not present

## 2016-11-13 NOTE — Progress Notes (Signed)
    Wendy BachelorSofia Morrison 19-Nov-1995 [id number]   History:    21 y.o.  12P1A1L1  Married.  Daughter is 326 month old, doing well.  Breastfeeding, but decreased milk production.  Visit with patient conducted in spanish.  RP:  New patient presenting for Contraception management   HPI:  Had an Urgent C/S 05/28/2016.  PP visit normal 07/14/2016.  Menses reg normal qmonth, restarted 09/2016.  LMP 11/13/2016, started today with light flow. Strict condom use.  No pelvic pain.  No dyspareunia.  Breast feeding, but milk production decreased.  Would like better contraception.  Past medical history,surgical history, family history and social history were all reviewed and documented in the EPIC chart.  Gynecologic History Patient's last menstrual period was 11/13/2016. Contraception: Condoms Last Pap: 2017. Results were: normal per patient Last mammogram: Never  Obstetric History OB History  Gravida Para Term Preterm AB Living  2 1 1   1 1   SAB TAB Ectopic Multiple Live Births        0 1    # Outcome Date GA Lbr Len/2nd Weight Sex Delivery Anes PTL Lv  2 Term 05/28/16 141w6d  7 lb 4.9 oz (3.315 kg) F CS-LTranv EPI, Gen  LIV  1 AB 06/23/15       N        ROS: A ROS was performed and pertinent positives and negatives are included in the history.  GENERAL: No fevers or chills. HEENT: No change in vision, no earache, sore throat or sinus congestion. NECK: No pain or stiffness. CARDIOVASCULAR: No chest pain or pressure. No palpitations. PULMONARY: No shortness of breath, cough or wheeze. GASTROINTESTINAL: No abdominal pain, nausea, vomiting or diarrhea, melena or bright red blood per rectum. GENITOURINARY: No urinary frequency, urgency, hesitancy or dysuria. MUSCULOSKELETAL: No joint or muscle pain, no back pain, no recent trauma. DERMATOLOGIC: No rash, no itching, no lesions. ENDOCRINE: No polyuria, polydipsia, no heat or cold intolerance. No recent change in weight. HEMATOLOGICAL: No anemia or easy bruising  or bleeding. NEUROLOGIC: No headache, seizures, numbness, tingling or weakness. PSYCHIATRIC: No depression, no loss of interest in normal activity or change in sleep pattern.     Exam:   BP 116/74   Ht 5\' 4"  (1.626 m)   Wt 170 lb (77.1 kg)   LMP 11/13/2016   Breastfeeding? Yes   BMI 29.18 kg/m   Body mass index is 29.18 kg/m.  General appearance : Well developed well nourished female. No acute distress HEENT: Eyes: no retinal hemorrhage or exudates,  Neck supple, trachea midline, no carotid bruits, no thyroidmegaly Lungs: Clear to auscultation, no rhonchi or wheezes, or rib retractions  Heart: Regular rate and rhythm, no murmurs or gallops Breast:Examined in sitting and supine position were symmetrical in appearance, no palpable masses or tenderness,  no skin retraction, no nipple inversion, no nipple discharge, no skin discoloration, no axillary or supraclavicular lymphadenopathy Abdomen: no palpable masses or tenderness, no rebound or guarding Extremities: no edema or skin discoloration or tenderness  Pelvic:  Deferred.  Normal PP exam.  Menstruating today.   Assessment/Plan:  21 y.o. female for annual exam   1. Encounter for other contraceptive management All contraceptive methods discussed with patient including BCPs, Patch, Nuvaring, Nexplanon, IUD and DepoProvera.  Risks/Benefits/Usage of each reviewed.  After counseling, patient opts for Nexplanon.  Pamphlet given in Spanish.  F/U for insertion.  Counseling >50% x 30 minutes on above issue.  Genia DelMarie-Lyne Tyshia Fenter MD, 3:39 PM 11/13/2016

## 2016-11-14 NOTE — Patient Instructions (Signed)
1. Encounter for other contraceptive management All contraceptive methods discussed with patient including BCPs, Patch, Nuvaring, Nexplanon, IUD and DepoProvera.  Risks/Benefits/Usage of each reviewed.  After counseling, patient opts for Nexplanon.  Pamphlet given in Spanish.  F/U for insertion.  Fue un placer de conocerla hoy!  Hasta luego para inserar el Nexplanon.

## 2016-11-19 ENCOUNTER — Ambulatory Visit (INDEPENDENT_AMBULATORY_CARE_PROVIDER_SITE_OTHER): Payer: Managed Care, Other (non HMO) | Admitting: Obstetrics & Gynecology

## 2016-11-19 ENCOUNTER — Encounter: Payer: Self-pay | Admitting: Obstetrics & Gynecology

## 2016-11-19 VITALS — BP 124/86

## 2016-11-19 DIAGNOSIS — Z30017 Encounter for initial prescription of implantable subdermal contraceptive: Secondary | ICD-10-CM

## 2016-11-19 NOTE — Patient Instructions (Signed)
1. Nexplanon insertion Easy insertion of Nexplanon as described above.  F/U Annual/Gyn exam 06/2017.  Fue un placer de verla hoy!

## 2016-11-19 NOTE — Progress Notes (Signed)
    Wendy Morrison 1996/02/20 [id number]        21 y.o.  G2P1011   RP:  Nexplanon insertion for contraception  Past medical history,surgical history, problem list, medications, allergies, family history and social history were all reviewed and documented in the EPIC chart.  Directed ROS with pertinent positives and negatives documented in the history of present illness/assessment and plan.  Exam:  Vitals:   11/19/16 1441  BP: 124/86   General appearance:  Normal                                              Nexplanon Procedure Note (insertion)   The patient was laying on her back with her nondominant arm flexed at the elbow and externally rotated. The insertion site was identified as the underside of the nondominant upper arm approximately 8 cm from the medial epicondyle of the humerus. 2 marks were made with a sterile marker: The first marked the spot where the Nexplanon  implant was to be inserted, and a second, marked a spot a few centimeters proximal to the first marke to guide the direction of the insertion. The area was cleansed with Betadine solution. The area was anesthetized with 1% lidocaine  (1 cc)  at the area the injection site and underneath the skin along the planned insertion tunnel. The preloaded disposable Nexplanon was removed from its sterile casing. The applicator was held above the needle at the textured surface area. The transparent protector was removed. With a freehand, the skin was stretched around the insertion site with a thumb and index finger. The skin was then punctured with the tip of the needle angled at 30. The Nexplanon applicator was lowered to a horizontal position. While lifting the skin with the tip of the needle the needle was then slid to its full length. The applicator was kept in sitting position with a needle inserted to its full length. The purple slider was unlocked by pushing it slightly downward. The slider was fully moved back until it  stopped. This allowed the implant to be in the final subdermal position and the needle was locked inside the body of the applicator. The applicator was then removed. Steri-Strips x 3 were applied over the incision and a bandaid, a Kerlix wrap was placed which patient is to remove tomorrow. No complications patient tolerated procedure well and was released home with instructions.  Assessment/Plan:  21 y.o. G2P1011   1. Nexplanon insertion Easy insertion of Nexplanon as described above.  F/U Annual/Gyn exam 06/2017.  Genia DelMarie-Lyne Nayelie Gionfriddo MD, 2:56 PM 11/19/2016

## 2016-11-20 ENCOUNTER — Encounter: Payer: Self-pay | Admitting: Anesthesiology

## 2016-11-23 ENCOUNTER — Encounter: Payer: Self-pay | Admitting: Obstetrics & Gynecology

## 2016-11-23 ENCOUNTER — Ambulatory Visit (INDEPENDENT_AMBULATORY_CARE_PROVIDER_SITE_OTHER): Payer: Managed Care, Other (non HMO) | Admitting: Obstetrics & Gynecology

## 2016-11-23 VITALS — BP 120/80 | Ht 64.0 in | Wt 170.0 lb

## 2016-11-23 DIAGNOSIS — L03114 Cellulitis of left upper limb: Secondary | ICD-10-CM

## 2016-11-23 DIAGNOSIS — T7840XA Allergy, unspecified, initial encounter: Secondary | ICD-10-CM

## 2016-11-23 MED ORDER — AZITHROMYCIN 250 MG PO TABS: 250.0000 mg | ORAL_TABLET | Freq: Every day | ORAL | 0 refills | Status: AC

## 2016-11-23 NOTE — Progress Notes (Signed)
    Wendy Morrison 12/10/1995 [id number]        21 y.o.  G2P1011   RP:  Itching and redness close to insertion site 4 days post Nexplanon insertion  HPI:  Small rash 1x1.5 cm along the line of insertion of the Nexplanon.  Very itchy there.  Removed steri-strips/Band-aid as she was scratching.  No discharge.  No pain and no lump.  No fever.  Past medical history,surgical history, problem list, medications, allergies, family history and social history were all reviewed and documented in the EPIC chart.  Directed ROS with pertinent positives and negatives documented in the history of present illness/assessment and plan.  Exam:  Vitals:   11/23/16 1408  BP: 120/80  Weight: 170 lb (77.1 kg)  Height: 5\' 4"  (1.626 m)   General appearance:  Normal  Left arm:  Nexplanon felt just under the skin, in good position.  Insertion site:  Skin closing well.  No sign of infection.  Along the line of insertion, small area of erythema with small vesicles.  Corresponds to area of itchiness.  Area measures about 1 x 1.5 cm.  Probably site of infiltration of Lidocaine.  Small ecchymosis at far edge of Nexplanon insertion.  No evidence of deep infection or hematoma.  Wound cleaned with Alcohol swab.  Steri-strip and band-aid applied.  Assessment/Plan:  21 y.o. G2P1011  1. Cellulitis of left upper extremity Small area of cellulitis vs allergic reaction to Lidocaine infiltration.  Decision to cover with Azithro (Allergic to Penicillin).  Treatment sent to pharmacy.  2. Allergic reaction, initial encounter May try 1% Hydrocortisone ointment to decrease inflammation and itching.  Counseling on above issues >50% x 10 minutes.  Genia DelMarie-Lyne Yamilet Mcfayden MD, 2:23 PM 11/23/2016

## 2016-11-23 NOTE — Patient Instructions (Signed)
1. Cellulitis of left upper extremity Small area of cellulitis vs allergic reaction to Lidocaine infiltration.  Decision to cover with Azithro (Allergic to Penicillin).  Treatment sent to pharmacy.  2. Allergic reaction, initial encounter May try 1% Hydrocortisone ointment to decrease inflammation and itching.  Good to see you today Keenan BachelorSofia!

## 2017-08-03 IMAGING — US US MFM FETAL BPP W/O NON-STRESS
2 series · 15 of 28 positions shown · non-contrast
Comparison: none

[Series 1: us mfm fetal bpp w/o non-stress · 26 acquisitions, 10 frames shown (1 of 2)]
[im 1/26]
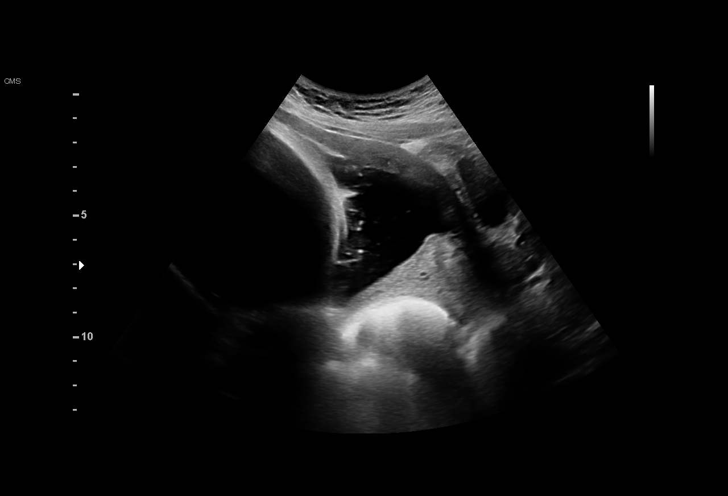
[im 3/26]
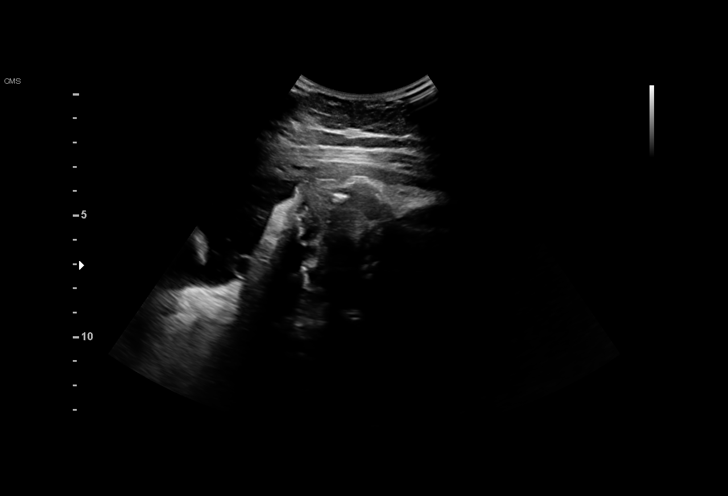
[im 6/26]
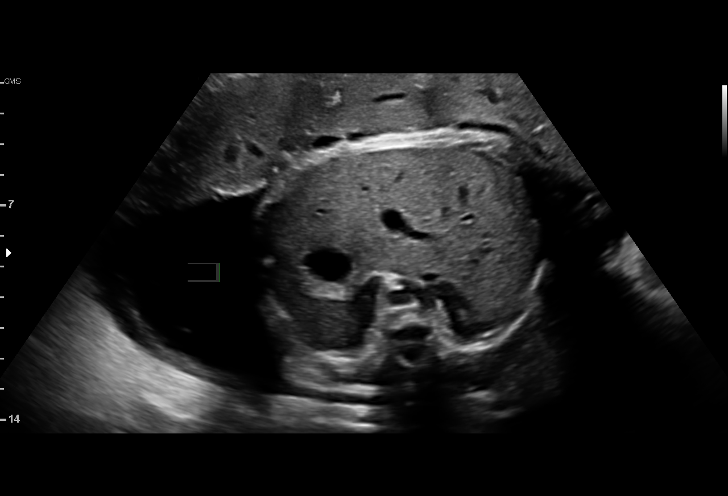
[im 9/26]
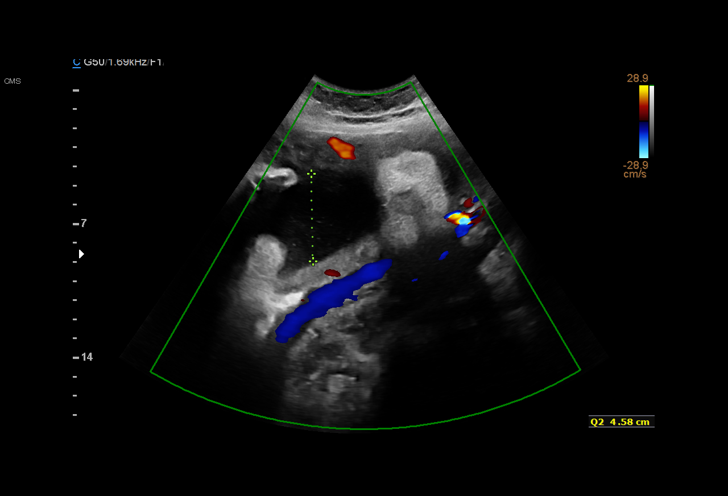
[im 12/26]
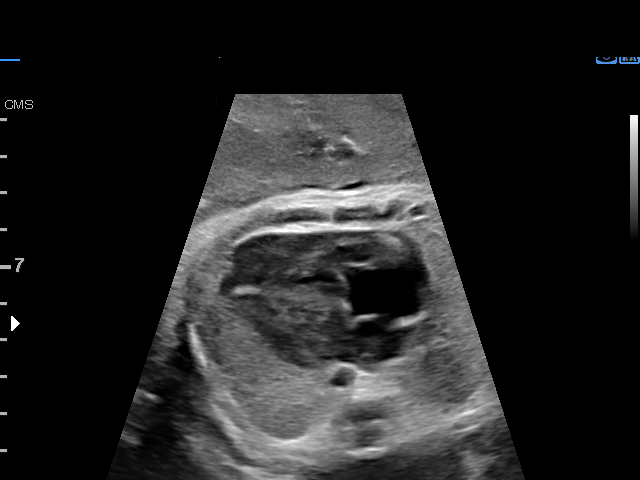
[im 15/26]
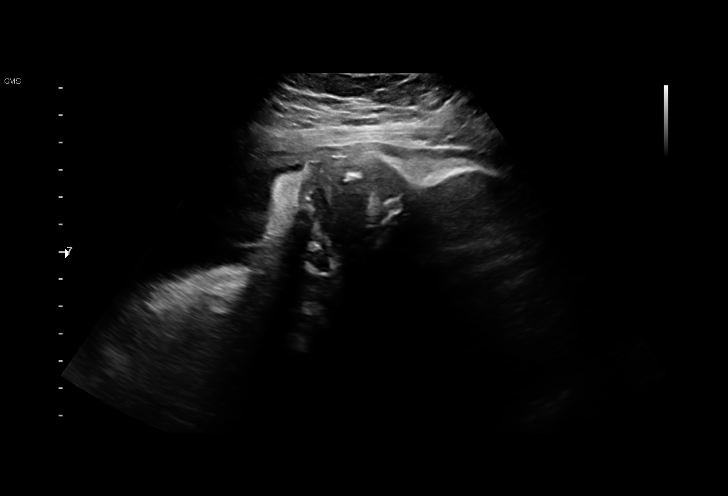
[im 18/26]
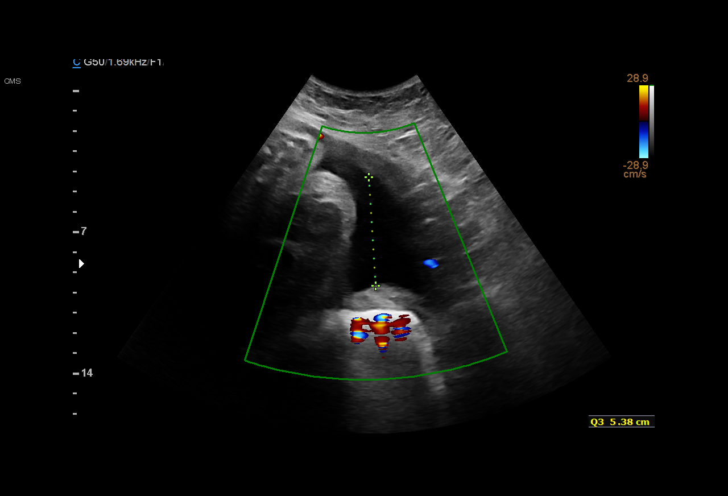
[im 21/26]
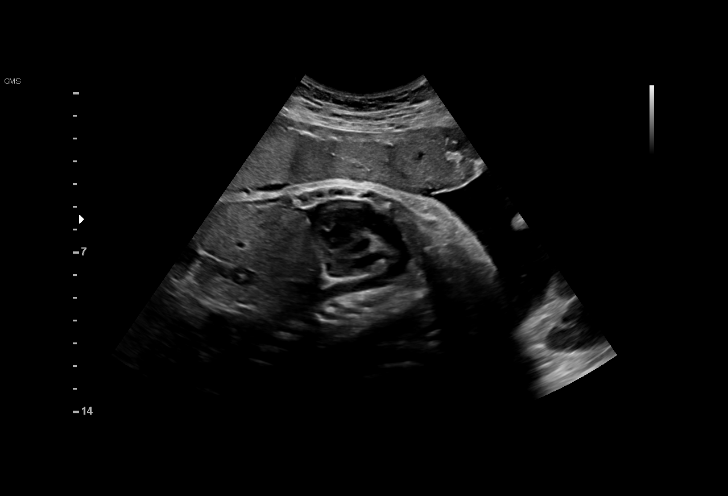
[im 23/26]
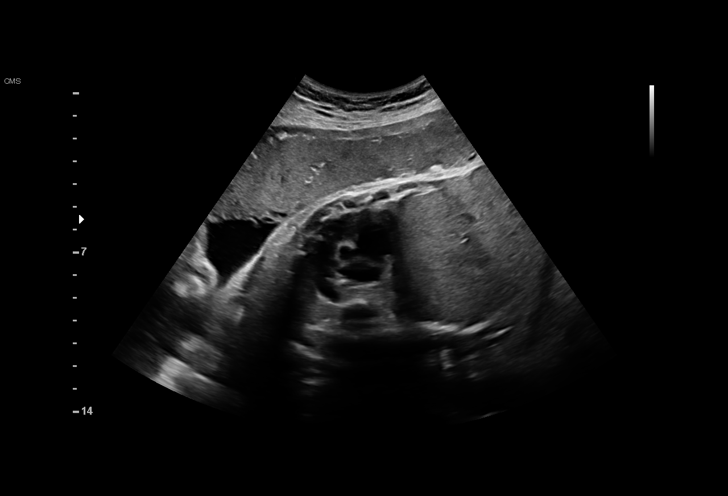
[im 26/26]
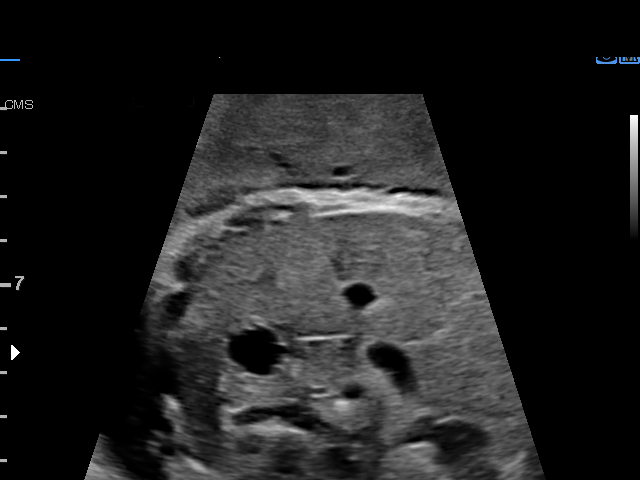

[Series 2: us mfm fetal bpp w/o non-stress · 15 acquisitions, 5 frames shown (2 of 2)]
[im 2/15]
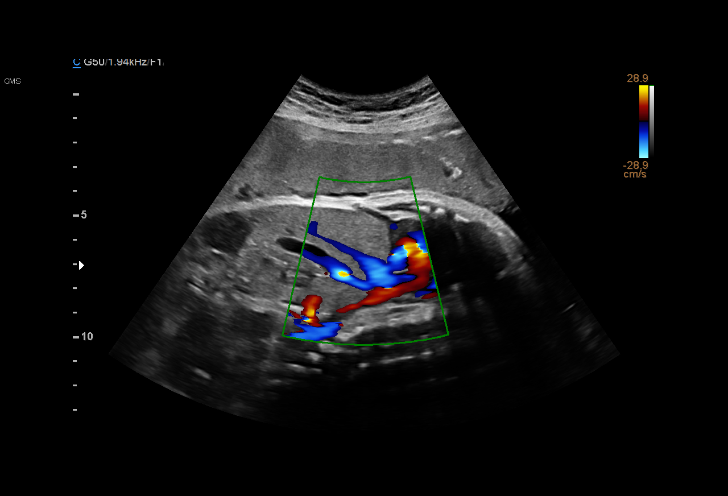
[im 5/15]
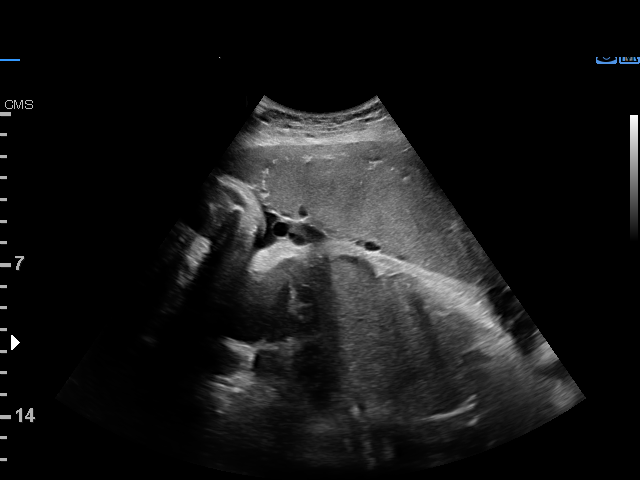
[im 8/15]
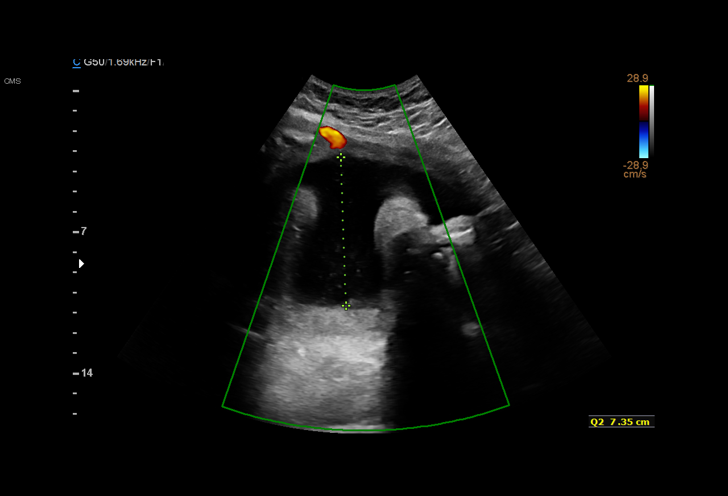
[im 11/15]
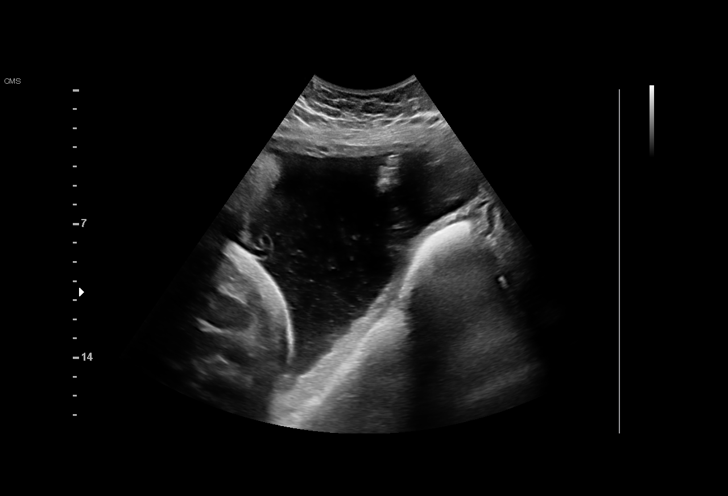
[im 15/15]
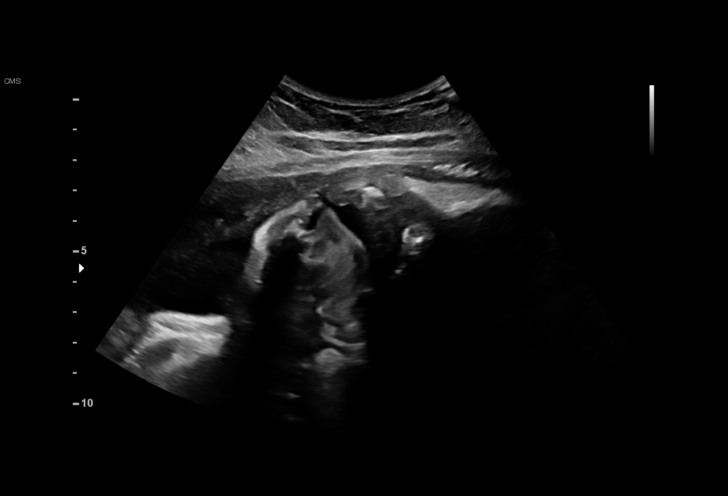

[15 of 28 positions shown; findings below may reference images not displayed]

1  MOHAMMADHASHEM BECKOVA           [id number]      [id number]     [id number]
Indications

40 weeks gestation of pregnancy
Postdate pregnancy (40-42 weeks)
OB History

Gravidity:    2         Term:   0        Prem:   0        SAB:   0
TOP:          1       Ectopic:  0        Living: 0
Fetal Evaluation

Num Of Fetuses:     1
Fetal Heart         124
Rate(bpm):
Cardiac Activity:   Observed
Presentation:       Cephalic

Amniotic Fluid
AFI FV:      Subjectively upper-normal

AFI Sum(cm)     %Tile       Largest Pocket(cm)
28.04           > 97

RUQ(cm)       RLQ(cm)       LUQ(cm)        LLQ(cm)
9.66
Biophysical Evaluation

Amniotic F.V:   Increased                  F. Tone:        Observed
F. Movement:    Observed                   Score:          [DATE]
F. Breathing:   Observed
Gestational Age
Best:          40w 0d    Det. By:   Previous Ultrasound      EDD:   05/22/16
(12/09/15)
Impression

SIUP at 40+0 weeks
Cephalic presentation
High normal amniotic fluid volume
BPP [DATE]
Recommendations

Deliver by 41+0 weeks

## 2018-02-22 ENCOUNTER — Encounter: Payer: Self-pay | Admitting: Obstetrics & Gynecology

## 2018-02-22 ENCOUNTER — Ambulatory Visit: Payer: Managed Care, Other (non HMO) | Admitting: Obstetrics & Gynecology

## 2018-02-22 VITALS — BP 136/84

## 2018-02-22 DIAGNOSIS — Z3046 Encounter for surveillance of implantable subdermal contraceptive: Secondary | ICD-10-CM

## 2018-02-22 DIAGNOSIS — M545 Low back pain: Secondary | ICD-10-CM

## 2018-02-22 DIAGNOSIS — G8929 Other chronic pain: Secondary | ICD-10-CM | POA: Diagnosis not present

## 2018-02-22 DIAGNOSIS — Z30011 Encounter for initial prescription of contraceptive pills: Secondary | ICD-10-CM

## 2018-02-22 MED ORDER — NORETHIN ACE-ETH ESTRAD-FE 1-20 MG-MCG(24) PO TABS: 1.0000 | ORAL_TABLET | Freq: Every day | ORAL | 4 refills | Status: DC

## 2018-02-22 NOTE — Patient Instructions (Signed)
1. Encounter for Nexplanon removal Side effects on Nexplanon, desires removal.  Nexplanon easily removed at as described above.  Precautions discussed with patient.  2. Encounter for initial prescription of contraceptive pills Needs contraception.  No contraindication to birth control pill.  Usage, risks and benefits of birth control pills reviewed.  Decision to start on the birth control pill with the generic of Loestrin 24 FE 1/20.    3. Chronic midline low back pain without sciatica Long-standing lower back pain in the midline worse after long working hours.  No neurologic symptoms in the lower legs.  Refer to physical therapy to evaluate posture and possible exercises to reinforce her core.  If no improvement, will refer to orthopedist.  Other orders - Norethindrone Acetate-Ethinyl Estrad-FE (LOESTRIN 24 FE) 1-20 MG-MCG(24) tablet; Take 1 tablet by mouth daily.  Keenan Bachelor, fue un placer verle hoy!

## 2018-02-22 NOTE — Progress Notes (Signed)
Wendy Morrison 06/15/1996 [id number]        22 y.o.  G2P1011 Married  RP: Side effects on Nexplanon  HPI: Desires removal of Nexplanon inserted 10/2016 because of weight gain and low libido.  Complains of long-standing lower back pain in the midline increase by long hours of work.  No neurologic symptoms in the legs.     OB History  Gravida Para Term Preterm AB Living  2 1 1   1 1   SAB TAB Ectopic Multiple Live Births        0 1    # Outcome Date GA Lbr Len/2nd Weight Sex Delivery Anes PTL Lv  2 Term 05/28/16 [redacted]w[redacted]d  7 lb 4.9 oz (3.315 kg) F CS-LTranv EPI, Gen  LIV  1 AB 06/23/15       N     Past medical history,surgical history, problem list, medications, allergies, family history and social history were all reviewed and documented in the EPIC chart.   Directed ROS with pertinent positives and negatives documented in the history of present illness/assessment and plan.  Exam:  Vitals:   02/22/18 1603  BP: 136/84   General appearance:  Normal                                                             Nexplanon procedure note (removal)  The patient presented to the office today requesting for removal of her Nexplanon that was placed in the year 10/2016 on her left arm.   On examination the nexplanon implant was palpated and the distal end  (end  closest to the elbow) was marked. The area was sterilized with Betadine solution. 1% lidocaine was used for local anesthesia and approximately 1 cc  was injected into the site that was marked where the incision was to be made. The local anesthetic was injected under the implant in an effort to keep it  close to the skin surface. Slight pressure pushing downward was made at the proximal end  of the implant in an effort to stabilize it. A bulge appeared indicating the distal end of the implant. A small transverse incision of 2 mm was made at that location. By gently pushing the implant toward the incision, the tip became visible.  Grasping the implant with a curved forcep facilitated in gently removing the implant. Full confirmation of the entire implant which is 4 cm long was inspected and was intact and was shown to the patient and discarded. After removing the implant, the incision was closed with 3Steri-Strips, a band-aid and a bandage. Patient will be instructed to remove the pressure bandage in 24 hours, the band-aid in 3 days and the Steri-Strips in 7 days.     Assessment/Plan:  22 y.o. G2P1011   1. Encounter for Nexplanon removal Side effects on Nexplanon, desires removal.  Nexplanon easily removed at as described above.  Precautions discussed with patient.  2. Encounter for initial prescription of contraceptive pills Needs contraception.  No contraindication to birth control pill.  Usage, risks and benefits of birth control pills reviewed.  Decision to start on the birth control pill with the generic of Loestrin 24 FE 1/20.    3. Chronic midline low back pain without sciatica Long-standing lower back pain in the midline worse after long  working hours.  No neurologic symptoms in the lower legs.  Refer to physical therapy to evaluate posture and possible exercises to reinforce her core.  If no improvement, will refer to orthopedist.  Other orders - Norethindrone Acetate-Ethinyl Estrad-FE (LOESTRIN 24 FE) 1-20 MG-MCG(24) tablet; Take 1 tablet by mouth daily.  Counseling on above issues and coordination of care more than 50% for 15 minutes.  Genia Del MD, 4:32 PM 02/22/2018

## 2018-02-23 ENCOUNTER — Telehealth: Payer: Self-pay | Admitting: *Deleted

## 2018-02-23 DIAGNOSIS — M545 Low back pain: Secondary | ICD-10-CM

## 2018-02-23 NOTE — Telephone Encounter (Signed)
Referral placed at [facility] they will call to schedule

## 2018-02-23 NOTE — Telephone Encounter (Signed)
-----   Message from Genia Del, MD sent at 02/22/2018  4:51 PM EDT ----- Regarding: Refer to Physical Therapy Lower Back pain worsen after a day at work.  No neurologic symptom.

## 2018-03-02 NOTE — Telephone Encounter (Signed)
Per referral note "Spoke with interpreter Thayer Ohm. Called patient and left a voicemail to call our office back to schedule an appointment."  Encounter will be closed.

## 2018-08-16 ENCOUNTER — Encounter: Payer: Self-pay | Admitting: Student in an Organized Health Care Education/Training Program

## 2018-11-03 ENCOUNTER — Telehealth: Payer: Self-pay | Admitting: *Deleted

## 2018-11-03 ENCOUNTER — Other Ambulatory Visit: Payer: Self-pay

## 2018-11-03 ENCOUNTER — Other Ambulatory Visit (HOSPITAL_COMMUNITY)
Admission: RE | Admit: 2018-11-03 | Discharge: 2018-11-03 | Disposition: A | Discharge status: Home / Self Care | Payer: Self-pay | Source: Ambulatory Visit | Attending: Internal Medicine | Admitting: Internal Medicine

## 2018-11-03 DIAGNOSIS — Z20828 Contact with and (suspected) exposure to other viral communicable diseases: Secondary | ICD-10-CM | POA: Insufficient documentation

## 2018-11-03 LAB — SARS CORONAVIRUS 2 BY RT PCR (HOSPITAL ORDER, PERFORMED IN ~~LOC~~ HOSPITAL LAB): SARS Coronavirus 2: NEGATIVE

## 2018-11-03 NOTE — Telephone Encounter (Signed)
Patient scheduled for covid19 testing.

## 2018-11-04 ENCOUNTER — Telehealth: Payer: Self-pay | Admitting: *Deleted

## 2018-11-04 NOTE — Telephone Encounter (Signed)
Pt's husband Cristian notified of pt's negative test result for COVID-19. Understanding verbalized.

## 2019-01-11 ENCOUNTER — Encounter: Payer: Managed Care, Other (non HMO) | Admitting: Obstetrics & Gynecology

## 2022-09-07 ENCOUNTER — Other Ambulatory Visit: Payer: Self-pay | Admitting: Family Medicine

## 2022-09-07 DIAGNOSIS — Z3481 Encounter for supervision of other normal pregnancy, first trimester: Secondary | ICD-10-CM

## 2022-09-08 ENCOUNTER — Other Ambulatory Visit (INDEPENDENT_AMBULATORY_CARE_PROVIDER_SITE_OTHER): Payer: Self-pay

## 2022-09-08 DIAGNOSIS — Z3481 Encounter for supervision of other normal pregnancy, first trimester: Secondary | ICD-10-CM

## 2022-09-08 LAB — POCT URINE PREGNANCY: Preg Test, Ur: POSITIVE — AB

## 2022-09-14 LAB — CBC/D/PLT+RPR+RH+ABO+RUBIGG...
Antibody Screen: NEGATIVE
Basophils Absolute: 0 10*3/uL (ref 0.0–0.2)
Basos: 0 %
Bilirubin, UA: NEGATIVE
EOS (ABSOLUTE): 0 10*3/uL (ref 0.0–0.4)
Eos: 0 %
Glucose, UA: NEGATIVE
HCV Ab: NONREACTIVE
HIV Screen 4th Generation wRfx: NONREACTIVE
Hematocrit: 40.5 % (ref 34.0–46.6)
Hemoglobin: 14.4 g/dL (ref 11.1–15.9)
Hepatitis B Surface Ag: NEGATIVE
Immature Grans (Abs): 0 10*3/uL (ref 0.0–0.1)
Immature Granulocytes: 0 %
Ketones, UA: NEGATIVE
Lymphocytes Absolute: 2.2 10*3/uL (ref 0.7–3.1)
Lymphs: 23 %
MCH: 30.9 pg (ref 26.6–33.0)
MCHC: 35.6 g/dL (ref 31.5–35.7)
MCV: 87 fL (ref 79–97)
Monocytes Absolute: 0.8 10*3/uL (ref 0.1–0.9)
Monocytes: 8 %
Neutrophils Absolute: 6.4 10*3/uL (ref 1.4–7.0)
Neutrophils: 69 %
Nitrite, UA: NEGATIVE
Platelets: 375 10*3/uL (ref 150–450)
Protein,UA: NEGATIVE
RBC, UA: NEGATIVE
RBC: 4.66 x10E6/uL (ref 3.77–5.28)
RDW: 12.3 % (ref 11.7–15.4)
RPR Ser Ql: NONREACTIVE
Rh Factor: POSITIVE
Rubella Antibodies, IGG: <0.9 index — ABNORMAL LOW (ref >0.99)
Specific Gravity, UA: 1.018 (ref 1.005–1.030)
Urobilinogen, Ur: 0.2 mg/dL (ref 0.2–1.0)
WBC: 9.4 10*3/uL (ref 3.4–10.8)
pH, UA: 7 (ref 5.0–7.5)

## 2022-09-14 LAB — HGB FRACTIONATION CASCADE
Hgb A2: 2.8 % (ref 1.8–3.2)
Hgb A: 97.2 % (ref 96.4–98.8)
Hgb F: 0 % (ref 0.0–2.0)
Hgb S: 0 %

## 2022-09-14 LAB — MICROSCOPIC EXAMINATION
Bacteria, UA: NONE SEEN
Casts: NONE SEEN /lpf

## 2022-09-14 LAB — HCV INTERPRETATION

## 2022-09-14 LAB — URINE CULTURE, OB REFLEX

## 2022-09-15 ENCOUNTER — Encounter: Payer: Self-pay | Admitting: Family Medicine

## 2022-09-15 ENCOUNTER — Ambulatory Visit (INDEPENDENT_AMBULATORY_CARE_PROVIDER_SITE_OTHER): Payer: Self-pay | Admitting: Family Medicine

## 2022-09-15 VITALS — BP 118/74 | HR 92 | Temp 98.1°F | Wt 165.5 lb

## 2022-09-15 DIAGNOSIS — Z3491 Encounter for supervision of normal pregnancy, unspecified, first trimester: Secondary | ICD-10-CM | POA: Insufficient documentation

## 2022-09-15 DIAGNOSIS — E038 Other specified hypothyroidism: Secondary | ICD-10-CM

## 2022-09-15 DIAGNOSIS — Z3481 Encounter for supervision of other normal pregnancy, first trimester: Secondary | ICD-10-CM

## 2022-09-15 NOTE — Patient Instructions (Addendum)
  Fue genial verte hoy!  Hoy hablamos de su embarazo, contine tomando su vitamina prenatal diariamente. Le har saber cualquier resultado anormal del anlisis de [place].  Vaya a MAU en el [facility] [place] si: ? Tiene dolor en la parte inferior del abdomen o en el rea plvica. ? Se te rompe fuente. A veces es un gran chorro de lquido, a veces es solo un chorrito que sigue mojando tu ropa interior o corriendo por tus piernas. ? Tienes sangrado vaginal.  Haga un seguimiento en su prxima cita programada el 44 de abril a las 8:50 am para [place] prueba de azcar y Ardelia Mems prueba de Papanicolaou. Si surge algo entre ahora y [place], no dude en comunicarse con nuestra oficina.   Gracias por permitirnos ser parte de su atencin mdica!  Gracias, Dr. Larae Grooms  It was great seeing you today!  Today we discussed your pregnancy, please continue to take your prenatal vitamin daily. I will let you know of any abnormal results from the blood work.   Go to the MAU at [place] at [facility] if: You have pain in your lower abdomen or pelvic area Your water breaks.  Sometimes it is a big gush of fluid, sometimes it is just a trickle that keeps getting your underwear wet or running down your legs You have vaginal bleeding.   Please follow up at your next scheduled appointment on 4/11 at 8:50 am for a sugar test and PAP smear, if anything arises between now and then, please don't hesitate to contact our office.   Thank you for allowing [place] to be a part of your medical care!  Thank you, Dr. Larae Grooms

## 2022-09-15 NOTE — Progress Notes (Signed)
Patient Name: Lauris Gainous Date of Birth: December 07, 1995 [place] Initial Prenatal Visit  Franka Mcdannel is a 27 y.o. year old G17P1011 at Unknown who presents for her initial prenatal visit. Pregnancy is not planned She reports positive home pregnancy test. She is taking a prenatal vitamin.  She denies pelvic pain or vaginal bleeding.   Pregnancy Dating: The patient is dated by LMP.  LMP: [id number] Period is certain:  Yes.  Periods were regular:  Yes.  LMP was a typical period:  Yes.  Using hormonal contraception in 3 months prior to conception: No  Lab Review: Blood type: B Rh Status: Positive Antibody screen: Negative HIV: Negative RPR: Negative Hemoglobin electrophoresis reviewed: Yes Results of OB urine culture are: Negative Rubella: Not immune, will need rubella vaccine following delivery  Hep C Ab: Negative Varicella status is Unknown, getting varicella titer today.  PMH: Reviewed and as detailed below: HTN: No  Gestational Hypertension/preeclampsia: No  Type 1 or 2 Diabetes: No  Depression:  No  Seizure disorder:  No VTE: No ,  History of STI No,  Abnormal Pap smear:  No, Genital herpes simplex:  No   PSH: Gynecologic Surgery:  yes, C-section in 2017 with first pregnancy  Surgical history reviewed, notable for: as above   Obstetric History: Obstetric history tab updated and reviewed. Had 1 abortion in 2017 and had a false positive pregnancy test in 2021.  Summary of prior pregnancies: 2 Cesarean delivery: Yes  Gestational Diabetes:  No Hypertension in pregnancy: No History of preterm birth: No History of LGA/SGA infant:  No History of shoulder dystocia: No Indications for referral were reviewed, and the patient has no obstetric indications for referral to [facility] at this time.   Social History: Partner's name: Marliss Czar  Tobacco use: No Alcohol use:  No Other substance use:  No  Current Medications:   Prenatal vitamin daily   Reviewed and appropriate in pregnancy.   Genetic and Infection Screen: Flow Sheet Updated Yes  Prenatal Exam: Gen: Well nourished, well developed.  No distress.  Vitals noted. HEENT: Normocephalic, atraumatic.  Neck supple without cervical lymphadenopathy, thyromegaly or thyroid nodules.  Fair dentition. CV: RRR no murmur, gallops or rubs Lungs: CTA B.  Normal respiratory effort without wheezes or rales. Abd: soft, NTND. +BS.  Uterus not appreciated above pelvis. Ext: No clubbing, cyanosis or edema. Psych: Normal grooming and dress.  Not depressed or anxious appearing.  Normal thought content and process without flight of ideas or looseness of associations  Assessment/Plan:  Wynnie Scarfone is a 27 y.o. G2P1011 at Unknown who presents to initiate prenatal care. She is doing well.  Current pregnancy issues include none.  Routine prenatal care: As dating is reliable, a dating ultrasound has not been ordered. Dating tab updated. Pre-pregnancy weight updated. Expected weight gain this pregnancy is 15-25 lbs. Prenatal labs reviewed, notable for non-immune rubella. Indications for referral to [facility] were reviewed and the patient does not meet criteria for referral.  Medication list reviewed and updated.  Recommended patient see a dentist for regular care.  Bleeding and pain precautions reviewed. Importance of prenatal vitamins reviewed.  The patient does have an indication for aspirin therapy beginning at 12-16 weeks. Aspirin therapy will be  recommended at next visit.  The patient will not age 80 or over at time of delivery. Referral to genetic counseling was not offered today.  An early 1 hour glucose tolerance test was not ordered but was discussed and will  be administered at the next visit. Pregnancy Medical Home and PHQ-9 forms completed, problems noted: Yes  2. Pregnancy issues include the following which were addressed today:  MAU precautions discussed  and advised to continue prenatal vitamin daily.  History of hypothyroidism,last on synthyroid at 27 years old and has not been on medication since. Last TSH prior to this pregnancy was wnl. Obtaining TSH today and will need to check TSH each trimester to closely monitor during pregnancy.   Follow up 2 weeks for PAP smear and 1 hour glucola. Discuss aspirin therapy at next visit.    Patient politely declined Spanish interpretor.

## 2022-09-15 NOTE — Assessment & Plan Note (Signed)
  Nursing Staff Provider  Office Location  [facility] Dating  LMP  [facility] Model [ ]  Traditional [ ]  Centering [ ]  Mom-Baby Dyad    Language  English/Spanish  Anatomy US    Flu Vaccine   Genetic/Carrier Screen  NIPS:    AFP:    Horizon:  TDaP Vaccine    Hgb A1C or  GTT Early  Third trimester   COVID Vaccine    LAB RESULTS   Rhogam   Blood Type B/Positive/-- (03/19 1650)   Baby Feeding Plan  Antibody Negative (03/19 1650)  Contraception  Rubella <0.90 (03/19 1650)  Circumcision  RPR Non Reactive (03/19 1650)   Pediatrician   HBsAg Negative (03/19 1650)   Support Person  HCVAb Negative  Prenatal Classes  HIV Non Reactive (03/19 1650)     BTL Consent  GBS   (For PCN allergy, check sensitivities)   VBAC Consent  Pap        DME Rx [ ]  BP cuff [ ]  Weight Scale Waterbirth  [ ]  Class [ ]  Consent [ ]  CNM visit  PHQ9 & GAD7 [  ] new OB [  ] 28 weeks  [  ] 36 weeks Induction  [ ]  Orders Entered [ ] Foley Y/N

## 2022-09-16 LAB — VARICELLA ZOSTER ANTIBODY, IGG: Varicella zoster IgG: 603 index (ref >165)

## 2022-09-16 LAB — TSH RFX ON ABNORMAL TO FREE T4: TSH: 1.74 u[IU]/mL (ref 0.450–4.500)

## 2022-10-01 ENCOUNTER — Ambulatory Visit (INDEPENDENT_AMBULATORY_CARE_PROVIDER_SITE_OTHER): Payer: Self-pay | Admitting: Family Medicine

## 2022-10-01 ENCOUNTER — Other Ambulatory Visit (HOSPITAL_COMMUNITY)
Admission: RE | Admit: 2022-10-01 | Discharge: 2022-10-01 | Disposition: A | Discharge status: Home / Self Care | Payer: Self-pay | Source: Ambulatory Visit | Attending: Family Medicine | Admitting: Family Medicine

## 2022-10-01 VITALS — BP 111/75 | HR 88 | Wt 165.4 lb

## 2022-10-01 DIAGNOSIS — Z124 Encounter for screening for malignant neoplasm of cervix: Secondary | ICD-10-CM

## 2022-10-01 DIAGNOSIS — Z3491 Encounter for supervision of normal pregnancy, unspecified, first trimester: Secondary | ICD-10-CM

## 2022-10-01 MED ORDER — ASPIRIN 81 MG PO TBEC: 81.0000 mg | DELAYED_RELEASE_TABLET | Freq: Every day | ORAL | 5 refills | Status: DC

## 2022-10-01 NOTE — Patient Instructions (Addendum)
  Fue genial verte hoy!  Me alegro que tu embarazo siga yendo bien! Contine tomando su vitamina prenatal diariamente. Empiece a tomar aspirina 81 mg al da, como comentamos.  Hoy nos hicimos una prueba de Papanicolaou para deteccin de cncer de cuello uterino, les [employer] cuando regresen. Tambin hicimos pruebas de azcar para ver si tiene diabetes crnica para que podamos controlarla ms de cerca durante su embarazo; le notificaremos este resultado pronto.  Vaya a MAU en el [facility] [place] si: ? Tiene dolor en la parte inferior del abdomen o en el rea plvica. ? Se te rompe fuente. A veces es un gran chorro de lquido, a veces es solo un chorrito que sigue mojando tu ropa interior o corriendo por tus piernas. ? Tienes sangrado vaginal.  Haga un seguimiento en su prxima cita programada el 25 de abril a las 8:50 a. m., si surge algo entre ahora y [place], no dude en comunicarse con nuestra oficina.   Gracias por permitirnos ser parte de su atencin mdica!  Gracias, Dr. Robyne Peers  It was great seeing you today!  I am glad your pregnancy continues to go well! Please continue taking your prenatal vitamin daily. Please start taking aspirin 81 mg daily, as we discussed.   Today we did a PAP smear for cervical cancer screening, I will let you know of the results when they return. We also did sugar testing to see if you have chronic diabetes so we can more closely monitor this during your pregnancy, we will notify you of this result soon.   Go to the MAU at [facility] at [facility] if: You have pain in your lower abdomen or pelvic area Your water breaks.  Sometimes it is a big gush of fluid, sometimes it is just a trickle that keeps getting your underwear wet or running down your legs You have vaginal bleeding.   Please follow up at your next scheduled appointment on 4/25 at 8:50 am, if anything arises between now and then, please  don't hesitate to contact our office.   Thank you for allowing [place] to be a part of your medical care!  Thank you, Dr. Robyne Peers

## 2022-10-01 NOTE — Assessment & Plan Note (Addendum)
-  discussed importance of aspirin for preeclampsia prevention, prescribed daily aspirin 81 mg -discussed importance of 1 hr glucola testing today to evaluate for pre-existing, chronic DM. Pending results -advised to continue prenatal vitamin daily -PAP smear performed today with GC/Chlamydia testing for cervical cancer screening -PHQ-9 score of 10 with negative question 9 reviewed.

## 2022-10-01 NOTE — Progress Notes (Addendum)
    SUBJECTIVE:   CHIEF COMPLAINT / HPI:   Patient is [redacted]w[redacted]d and presents for follow up to get her cervical cancer screening and glucola testing today. Denies any concerns at this time. Denies vaginal bleeding, leakage or gush of fluid and abdominal or pelvic pain. Endorses taking her prenatal vitamin daily as advised.   OBJECTIVE:   BP 111/75   Pulse 88   Wt 165 lb 6 oz (75 kg)   LMP 07/05/2022 (Exact Date)   BMI 28.39 kg/m   General: Patient well-appearing, in no acute distress. Resp: normal work of breathing noted GU: normal labia without rashes or external lesions noted, no cervical or vaginal discharge noted, no polyps noted  GU exam performed in the presence of chaperone, [employer], CMA.   ASSESSMENT/PLAN:   First trimester pregnancy -discussed importance of aspirin for preeclampsia prevention, prescribed daily aspirin 81 mg -discussed importance of 1 hr glucola testing today to evaluate for pre-existing, chronic DM. Pending results -advised to continue prenatal vitamin daily -PAP smear performed today with GC/Chlamydia testing for cervical cancer screening -PHQ-9 score of 10 with negative question 9 reviewed.      Patient politely declines use of formal Spanish interpretation.   Reece Leader, DO [place]

## 2022-10-02 LAB — CERVICOVAGINAL ANCILLARY ONLY
Bacterial Vaginitis (gardnerella): NEGATIVE
Candida Glabrata: NEGATIVE
Candida Vaginitis: NEGATIVE
Chlamydia: NEGATIVE
Comment: NEGATIVE
Comment: NEGATIVE
Comment: NEGATIVE
Comment: NEGATIVE
Comment: NEGATIVE
Comment: NORMAL
Neisseria Gonorrhea: NEGATIVE
Trichomonas: NEGATIVE

## 2022-10-02 LAB — GLUCOSE TOLERANCE, 1 HOUR: Glucose, 1Hr PP: 136 mg/dL (ref 70–199)

## 2022-10-05 LAB — CYTOLOGY - PAP: Diagnosis: NEGATIVE

## 2022-10-15 ENCOUNTER — Ambulatory Visit (INDEPENDENT_AMBULATORY_CARE_PROVIDER_SITE_OTHER): Payer: Self-pay | Admitting: Family Medicine

## 2022-10-15 ENCOUNTER — Other Ambulatory Visit: Payer: Self-pay

## 2022-10-15 ENCOUNTER — Other Ambulatory Visit: Payer: Self-pay | Admitting: Family Medicine

## 2022-10-15 VITALS — BP 111/73 | HR 86 | Temp 98.1°F | Wt 164.8 lb

## 2022-10-15 DIAGNOSIS — Z3481 Encounter for supervision of other normal pregnancy, first trimester: Secondary | ICD-10-CM

## 2022-10-15 NOTE — Patient Instructions (Addendum)
  Fue genial verte hoy!  Contine tomando sus vitaminas prenatales y aspirina diariamente.  Su prueba de glucosa de 1 hora fue ligeramente elevada, por lo que necesitaremos ms pruebas como comentamos. Nos haremos una prueba de glucosa de 3 horas, esto ser el 29/4 a las 8:30 am. Yetta Barre ser solo una visita al laboratorio.  Por favor, vuelva a contactarme el 23 de mayo a las 8:30 a.m.  Vaya a MAU en el [facility] en [place] si: ? Tiene dolor en la parte inferior del abdomen o en el rea plvica. ? Se te rompe fuente. A veces es un gran chorro de lquido, a veces es solo un chorrito que sigue mojando tu ropa interior o corriendo por tus piernas. ? Tienes sangrado vaginal.  Haga un seguimiento en su prxima cita programada; si surge algo entre ahora y [place], no dude en comunicarse con nuestra oficina.   Gracias por permitirnos ser parte de su atencin mdica!  Gracias, Dr. Robyne Peers  It was great seeing you today!  Please continue to take your prenatal vitamin and aspirin daily.  Your 1 hour glucose testing was very slightly elevated so we will need further testing as we discussed. We will get a 3 hour glucose test, this will be on 4/29 at 8:30 am. This will be a lab visit only.   Please follow up with me again on 5/23 at 8:30 am.   Go to the MAU at [facility] at [facility] if: You have pain in your lower abdomen or pelvic area Your water breaks.  Sometimes it is a big gush of fluid, sometimes it is just a trickle that keeps getting your underwear wet or running down your legs You have vaginal bleeding.   Please follow up at your next scheduled appointment, if anything arises between now and then, please don't hesitate to contact our office.   Thank you for allowing [place] to be a part of your medical care!  Thank you, Dr. Robyne Peers

## 2022-10-15 NOTE — Assessment & Plan Note (Signed)
  Nursing Staff Provider  Office Location  [facility] Dating  LMP  [facility] Model  Traditional  Centering  Mom-Baby Dyad    Language  English Anatomy US    Flu Vaccine   Genetic/Carrier Screen  NIPS:    AFP:    Horizon:  TDaP Vaccine    Hgb A1C or  GTT Early  Third trimester   COVID Vaccine    LAB RESULTS   Rhogam   Blood Type B/Positive/-- (03/19 1650)   Baby Feeding Plan  Antibody Negative (03/19 1650)  Contraception  Rubella <0.90 (03/19 1650)  Circumcision  RPR Non Reactive (03/19 1650)   Pediatrician   HBsAg Negative (03/19 1650)   Support Person  HCVAb   Prenatal Classes  HIV Non Reactive (03/19 1650)     BTL Consent  GBS   (For PCN allergy, check sensitivities)   VBAC Consent  Pap Negative 10/01/2022       DME Rx  BP cuff  Weight Scale Waterbirth   Class  Consent  CNM visit  PHQ9 & GAD7 [  ] new OB [  ] 28 weeks  [  ] 36 weeks Induction   Orders Entered Foley Y/N

## 2022-10-15 NOTE — Progress Notes (Signed)
  Patient Name: Wendy Morrison Date of Birth: 10-07-1995 [facility] Prenatal Visit  Wendy Morrison is a 27 y.o. G3P1011 at [redacted]w[redacted]d here for routine follow up. She is dated by LMP.  She reports headache but not currently.  She denies vaginal bleeding.  See flow sheet for details.  Vitals:   10/15/22 0906  BP: 111/73  Pulse: 86  Temp: 98.1 F (36.7 C)     A/P: Pregnancy at [redacted]w[redacted]d.  Doing well.    Routine Prenatal Care:  Dating reviewed, dating tab is correct. Influenza vaccine not given as it is not influenza season. COVID vaccination was discussed and patient will consider.  Pre-existing diabetes screening: 1 hr glucola 136, scheduled for 3 hr glucola on 4/29.  Anatomy ultrasound ordered to be scheduled at 18-20 weeks. Pregnancy education including expected weight gain in pregnancy, OTC medication use, continued use of prenatal vitamin, smoking cessation if applicable, and nutrition in pregnancy.   Bleeding and pain precautions reviewed.  2. Pregnancy issues include the following and were addressed as appropriate today:  Headaches: Likely tension-type headaches. Advised to get plenty of rest and hydrated. Recommended meditation, yoga and other relaxing techniques.  Hypothyroidism: will need to recheck TSH every trimester.  Problem list  and pregnancy box updated: Yes.   Follow up scheduled in 4 weeks on 5/23.  Patient politely declines use of Spanish interpretor.

## 2022-10-19 ENCOUNTER — Encounter: Payer: Self-pay | Admitting: Family Medicine

## 2022-10-19 ENCOUNTER — Other Ambulatory Visit (INDEPENDENT_AMBULATORY_CARE_PROVIDER_SITE_OTHER): Payer: Self-pay

## 2022-10-19 DIAGNOSIS — Z3481 Encounter for supervision of other normal pregnancy, first trimester: Secondary | ICD-10-CM

## 2022-10-19 LAB — POCT CBG (FASTING - GLUCOSE)-MANUAL ENTRY: Glucose Fasting, POC: 85 mg/dL (ref 70–99)

## 2022-10-20 LAB — GESTATIONAL GLUCOSE TOLERANCE
Glucose, Fasting: 77 mg/dL (ref 70–94)
Glucose, GTT - 1 Hour: 110 mg/dL (ref 70–179)
Glucose, GTT - 2 Hour: 85 mg/dL (ref 70–154)
Glucose, GTT - 3 Hour: 55 mg/dL — ABNORMAL LOW (ref 70–139)

## 2022-11-12 ENCOUNTER — Ambulatory Visit (INDEPENDENT_AMBULATORY_CARE_PROVIDER_SITE_OTHER): Payer: Self-pay | Admitting: Family Medicine

## 2022-11-12 VITALS — BP 100/60 | HR 94 | Wt 167.4 lb

## 2022-11-12 DIAGNOSIS — Z3491 Encounter for supervision of normal pregnancy, unspecified, first trimester: Secondary | ICD-10-CM

## 2022-11-12 DIAGNOSIS — Z3492 Encounter for supervision of normal pregnancy, unspecified, second trimester: Secondary | ICD-10-CM

## 2022-11-12 NOTE — Patient Instructions (Addendum)
It was great seeing you today!  I am glad that your pregnancy is going well! Please continue to take your prenatal vitamin and aspirin daily.   Go to the MAU at [facility] at [facility] if: You have cramping/contractions that do not go away with drinking water Your water breaks.  Sometimes it is a big gush of fluid, sometimes it is just a trickle that keeps getting your underwear wet or running down your legs You have vaginal bleeding.    You do not feel your baby moving like normal.  If you do not, get something to eat and drink and lay down and focus on feeling your baby move. If your baby is still not moving like normal, you should go to MAU.   Your anatomy ultrasound is scheduled for 5/30 at 7:30 am.   Please follow up at your next scheduled appointment on 6/20 at 10 am, if anything arises between now and then, please don't hesitate to contact our office.   Thank you for allowing [place] to be a part of your medical care!  Thank you, Dr. Farrel Demark genial verte hoy!  Me alegro que tu embarazo vaya bien! Contine tomando sus vitaminas prenatales y aspirina diariamente.  Vaya a MAU en el [facility] [place] si: ? Tiene calambres/contracciones que no desaparecen con agua potable. ? Se te rompe fuente. A veces es un gran chorro de lquido, a veces es solo un chorrito que sigue mojando tu ropa interior o corriendo por tus piernas. ? Tienes sangrado vaginal. ? No siente que su beb se mueve normalmente. Si no lo hace, coma y beba algo, acustese y concntrese en sentir cmo se mueve su beb. Si tu beb an no se mueve con normalidad, debes acudir a MAU.  Su ultrasonido de anatoma est programado para el 30/05 a las 7:30 am.  Maricela Curet un seguimiento en su prxima cita programada el 20 de junio a las 10 a. m., si surge algo entre ahora y [place], no dude en comunicarse con nuestra oficina.   Gracias por permitirnos ser parte de su atencin  mdica!  Gracias, Dr. Robyne Peers

## 2022-11-12 NOTE — Assessment & Plan Note (Signed)
  Nursing Staff Provider  Office Location  [facility] Dating  LMP  [facility] Model [ ]  Traditional [ ]  Centering [ ]  Mom-Baby Dyad    Language  English, Spanish Anatomy US  Scheduled 5/30  Flu Vaccine   Genetic/Carrier Screen  NIPS:    AFP:    Horizon:  TDaP Vaccine    Hgb A1C or  GTT Early  Third trimester   COVID Vaccine    LAB RESULTS   Rhogam   Blood Type B/Positive/-- (03/19 1650)   Baby Feeding Plan  Antibody Negative (03/19 1650)  Contraception  Rubella <0.90 (03/19 1650)  Circumcision  RPR Non Reactive (03/19 1650)   Pediatrician   HBsAg Negative (03/19 1650)   Support Person Friend HCVAb Negative  Prenatal Classes  HIV Non Reactive (03/19 1650)     BTL Consent  GBS   (For PCN allergy, check sensitivities)   VBAC Consent  Pap  4/11       DME Rx [ ]  BP cuff [ ]  Weight Scale Waterbirth  [ ]  Class [ ]  Consent [ ]  CNM visit  PHQ9 & GAD7 [  ] new OB [  ] 28 weeks  [  ] 36 weeks Induction  [ ]  Orders Entered [ ] Foley Y/N

## 2022-11-12 NOTE — Progress Notes (Signed)
  Patient Name: Wendy Morrison Date of Birth: 07/24/1995 [facility] Prenatal Visit  Wendy Morrison is a 27 y.o. G3P1011 at [redacted]w[redacted]d here for routine follow up. She is dated by LMP.  She reports no complaints.  She denies vaginal bleeding.  See flow sheet for details.  Vitals:   11/12/22 0844  BP: 100/60  Pulse: 94     A/P: Pregnancy at [redacted]w[redacted]d.  Doing well.    Routine Prenatal Care:  Dating reviewed, dating tab is correct. Fetal heart tones appropriate. Influenza vaccine not given as it is not influenza season. COVID vaccination was discussed and strongly encouraged. Patient will consider. 3 hour glucola appropriate, repeat at 28 weeks to assess for gestational DM discussed.  Anatomy ultrasound ordered to be scheduled at 18-20 weeks, scheduled for 5/30. Pregnancy education including expected weight gain in pregnancy, OTC medication use, continued use of prenatal vitamin, smoking cessation if applicable, and nutrition in pregnancy.   Bleeding and pain precautions reviewed.  2. Pregnancy issues include the following and were addressed as appropriate today:  Continue prenatal vitamin and aspirin, reiterated the importance of this.  Hypothyroidism: will need to recheck TSH every trimester.  Problem list  and pregnancy box updated: Yes.   Follow up 4 weeks, scheduled for faculty OB clinic on 6/20.  Patient politely declines use of Spanish interpretation.

## 2022-11-19 ENCOUNTER — Encounter: Payer: Self-pay | Admitting: *Deleted

## 2022-11-19 ENCOUNTER — Ambulatory Visit: Payer: Self-pay | Attending: Family Medicine

## 2022-11-19 ENCOUNTER — Ambulatory Visit: Payer: Self-pay | Admitting: *Deleted

## 2022-11-19 VITALS — BP 120/70 | HR 90

## 2022-11-19 DIAGNOSIS — Z3689 Encounter for other specified antenatal screening: Secondary | ICD-10-CM

## 2022-11-19 DIAGNOSIS — Z3A19 19 weeks gestation of pregnancy: Secondary | ICD-10-CM | POA: Insufficient documentation

## 2022-11-19 DIAGNOSIS — Z363 Encounter for antenatal screening for malformations: Secondary | ICD-10-CM | POA: Insufficient documentation

## 2022-11-19 DIAGNOSIS — Z3481 Encounter for supervision of other normal pregnancy, first trimester: Secondary | ICD-10-CM | POA: Insufficient documentation

## 2022-11-19 DIAGNOSIS — O34219 Maternal care for unspecified type scar from previous cesarean delivery: Secondary | ICD-10-CM | POA: Insufficient documentation

## 2022-11-28 ENCOUNTER — Encounter: Payer: Self-pay | Admitting: Family Medicine

## 2022-11-30 ENCOUNTER — Ambulatory Visit (INDEPENDENT_AMBULATORY_CARE_PROVIDER_SITE_OTHER): Payer: Self-pay | Admitting: Family Medicine

## 2022-11-30 ENCOUNTER — Other Ambulatory Visit: Payer: Self-pay

## 2022-11-30 VITALS — BP 98/65 | HR 98 | Temp 97.6°F | Wt 173.6 lb

## 2022-11-30 DIAGNOSIS — Z3482 Encounter for supervision of other normal pregnancy, second trimester: Secondary | ICD-10-CM

## 2022-11-30 DIAGNOSIS — R519 Headache, unspecified: Secondary | ICD-10-CM | POA: Insufficient documentation

## 2022-11-30 DIAGNOSIS — R42 Dizziness and giddiness: Secondary | ICD-10-CM

## 2022-11-30 MED ORDER — DOXYLAMINE SUCCINATE (SLEEP) 25 MG PO TABS: 12.5000 mg | ORAL_TABLET | Freq: Every evening | ORAL | 0 refills | Status: DC | PRN

## 2022-11-30 MED ORDER — VITAMIN B-6 25 MG PO TABS: 25.0000 mg | ORAL_TABLET | Freq: Three times a day (TID) | ORAL | 0 refills | Status: DC

## 2022-11-30 NOTE — Patient Instructions (Addendum)
It was great seeing you today!  Im sorry you have not been feeling well. Today we are going to check some blood work and I will call you if anything is abnormal.   Make sure to stay well hydrated, eat small well balanced meals.   I sent in pyridoxine which you can take 3 times a day as needed for nausea as well as doxylamine to take at night.  Continue taking Tylenol for headaches. If worsens, you have vision changes, body weakness or new and concerning symptoms please be seen at the MAU.   Feel free to call with any questions or concerns at any time, at [phone].   Take care,  Dr. Cora Collum [facility]

## 2022-11-30 NOTE — Progress Notes (Signed)
    SUBJECTIVE:   CHIEF COMPLAINT / HPI:   Patient is G3P1011 at [redacted]weeks gestation and presents with headaches and dizziness.   Patient endorses headache on right side and feels dizzy. Takes Tylenol but does not feel improvement. Headaches last for hours at a time and has happened a couple of days a week for the past few weeks. Not worsened with light or sound. Does have nausea but no emesis. On Saturday felt blurry vision and extremity tingling but not currently. Feeling SOB as if she is not getting full air. Feels worse on exertion. Denies symptoms when going from sitting to standing. Yesterday tried to go hiking but couldn't do it. Tries to drink water. Has been eating ok but feeling nauseas after. Not taking medication for nausea. Did not feel this way in previous pregnancy. Feels baby move often.   PERTINENT  PMH / PSH: Reviewed   OBJECTIVE:   BP 98/65   Pulse 98   Temp 97.6 F (36.4 C)   Wt 173 lb 9.6 oz (78.7 kg)   LMP 07/05/2022 (Exact Date)   BMI 29.80 kg/m    Physical exam General: well appearing, NAD HEENT: PERRL. EOMI. Normal conjunctiva  Cardiovascular: RRR, no murmurs Fetal heart rate: 154 bpm Lungs: CTAB. Normal WOB Abdomen: soft, non-distended, non-tender Skin: warm, dry. No edema Neuro: Alert and oriented. CN 2-12 in tact. Normal sensation bilaterally   ASSESSMENT/PLAN:   Headache Patient presents with intermittent headaches, nausea, SOB and dizziness for the past few weeks. Vitals are reassuringly stable. Normal neuro exam. Fetal heart rate appropriate. Symptoms worsened on Saturday when she was at the pool and out in the sun so possibly related to dehydration. Will check blood work to check anemia, electrolytes, liver function, and thyroid given remote history of possible hypothyroidism. Will treat the nausea with Pyridoxine and Doxylamine. Discussed staying well hydrated, eating small well balanced meals particularly since symptoms seem to worsen after meals.  Can treat for GERD at follow up if needed. Continue Tylenol prn for headaches. Discussed red flag symptoms and return precautions. Next appt on 6/20.     Cora Collum, DO [facility]

## 2022-11-30 NOTE — Assessment & Plan Note (Signed)
Patient presents with intermittent headaches, nausea, SOB and dizziness for the past few weeks. Vitals are reassuringly stable. Normal neuro exam. Fetal heart rate appropriate. Symptoms worsened on Saturday when she was at the pool and out in the sun so possibly related to dehydration. Will check blood work to check anemia, electrolytes, liver function, and thyroid given remote history of possible hypothyroidism. Will treat the nausea with Pyridoxine and Doxylamine. Discussed staying well hydrated, eating small well balanced meals particularly since symptoms seem to worsen after meals. Can treat for GERD at follow up if needed. Continue Tylenol prn for headaches. Discussed red flag symptoms and return precautions. Next appt on 6/20.

## 2022-12-01 LAB — COMPREHENSIVE METABOLIC PANEL
ALT: 6 IU/L (ref 0–32)
AST: 10 IU/L (ref 0–40)
Albumin/Globulin Ratio: 1.7
Albumin: 3.8 g/dL — ABNORMAL LOW (ref 4.0–5.0)
Alkaline Phosphatase: 56 IU/L (ref 44–121)
BUN/Creatinine Ratio: 9 (ref 9–23)
BUN: 5 mg/dL — ABNORMAL LOW (ref 6–20)
Bilirubin Total: 0.4 mg/dL (ref 0.0–1.2)
CO2: 20 mmol/L (ref 20–29)
Calcium: 8.8 mg/dL (ref 8.7–10.2)
Chloride: 104 mmol/L (ref 96–106)
Creatinine, Ser: 0.56 mg/dL — ABNORMAL LOW (ref 0.57–1.00)
Globulin, Total: 2.2 g/dL (ref 1.5–4.5)
Glucose: 83 mg/dL (ref 70–99)
Potassium: 3.8 mmol/L (ref 3.5–5.2)
Sodium: 137 mmol/L (ref 134–144)
Total Protein: 6 g/dL (ref 6.0–8.5)
eGFR: 129 mL/min/{1.73_m2} (ref >59)

## 2022-12-01 LAB — CBC
Hematocrit: 35.7 % (ref 34.0–46.6)
Hemoglobin: 12 g/dL (ref 11.1–15.9)
MCH: 30.3 pg (ref 26.6–33.0)
MCHC: 33.6 g/dL (ref 31.5–35.7)
MCV: 90 fL (ref 79–97)
Platelets: 242 10*3/uL (ref 150–450)
RBC: 3.96 x10E6/uL (ref 3.77–5.28)
RDW: 12.5 % (ref 11.7–15.4)
WBC: 7.7 10*3/uL (ref 3.4–10.8)

## 2022-12-01 LAB — TSH RFX ON ABNORMAL TO FREE T4: TSH: 1.2 u[IU]/mL (ref 0.450–4.500)

## 2022-12-10 ENCOUNTER — Ambulatory Visit (HOSPITAL_COMMUNITY)
Admission: RE | Admit: 2022-12-10 | Discharge: 2022-12-10 | Disposition: A | Discharge status: Home / Self Care | Payer: Self-pay | Source: Ambulatory Visit | Attending: Family Medicine | Admitting: Family Medicine

## 2022-12-10 ENCOUNTER — Ambulatory Visit (INDEPENDENT_AMBULATORY_CARE_PROVIDER_SITE_OTHER): Payer: Self-pay | Admitting: Family Medicine

## 2022-12-10 ENCOUNTER — Other Ambulatory Visit: Payer: Self-pay

## 2022-12-10 VITALS — BP 106/63 | HR 88 | Wt 174.8 lb

## 2022-12-10 DIAGNOSIS — Z3482 Encounter for supervision of other normal pregnancy, second trimester: Secondary | ICD-10-CM

## 2022-12-10 DIAGNOSIS — R55 Syncope and collapse: Secondary | ICD-10-CM

## 2022-12-10 NOTE — Progress Notes (Signed)
Lay- 100/62 80  Sit-101/68 84  Stand- 99/69 94

## 2022-12-10 NOTE — Patient Instructions (Addendum)
We scheduled an ultrasound of your heart tomorrow June 21st at 3:00pm, to make sure there is no cardiac cause of your chest pressure and near fainting episodes.   Due to these symptoms, I would like to follow up with you in one week.  We will let you know the results of the labs we are doing today.   Continue to think about if you would like a C-section or to try a vaginal delivery for this pregnancy. Continue to take aspirin and prenatal vitamin daily.   Prenatal Classes Go to [email or web address] for more information on the pregnancy and child birth classes that Cora has to offer.   Pregnancy Related Return Precautions The follow are signs/symptoms that are abnormal in pregnancy and may require further evaluation by a physician: Go to the MAU at [facility] at [facility] if: You have cramping/contractions that do not go away with drinking water, especially if they are lasting 30 seconds to 1.5 minutes, coming and going every 5-10 minutes for an hour or more, or are getting stronger and you cannot walk or talk while having a contraction/cramp. Your water breaks.  Sometimes it is a big gush of fluid, sometimes it is just a trickle that keeps getting your underwear wet or running down your legs You have vaginal bleeding.    You do not feel your baby moving like normal.  If you do not, get something to eat and drink (something cold or something with sugar like peanut butter or juice) and lay down and focus on feeling your baby move. If your baby is still not moving like normal, you should go to MAU. You should feel your baby move 6 times in one hour, or 10 times in two hours. You have a persistent headache that does not go away with 1 g of Tylenol, vision changes, chest pain, difficulty breathing, severe pain in your right upper abdomen, worsening leg swelling- these can all be signs of high blood pressure in pregnancy and need to be evaluated by a  provider immediately  These are all concerning in pregnancy and if you have any of these I recommend you call your PCP and present to the Maternity Admissions Unit (map below) for further evaluation. Please continue taking your aspirin and daily prenatal vitamin.   For your chest pressure and feeling of fainting we are drawing labs today and will put in a referral to cardiology. We will let you know the results of the labs. Please monitor for any phone calls to schedule your cardiology appointment. I would like to follow up in one week for these symptoms.   For your headaches please try taking Mig-relief. This is an over the counter medicine.   For any pregnancy-related emergencies, please go to the Maternity Admissions Unit in the [facility]'s & [facility] Center at [facility]. You will use hospital Entrance C.    Our clinic number is [phone].   Dr Miquel Dunn

## 2022-12-10 NOTE — Progress Notes (Signed)
  [facility] Prenatal Visit  Wendy Morrison is a 27 y.o. G3P1011 at [redacted]w[redacted]d here for routine follow up. She is dated by LMP.  She reports fatigue and headache.  She reports good fetal movement. No bleeding, loss of fluid, contractions.   Patient also reports episodes of  intense chest pressure with pre-syncope type symptoms. Reports 3 episodes in the last week during which patient all of a sudden started  to feel hot, light headed, intense chest pressure, blurry vision lasting a couple minutes.Denies palpitations, vision closing in. No history of cardiac  problems.   Endorses almost daily headaches. Denies photo/phonophobia.Has tried tylenol and benadryl  without much relief. Has also tried cooling helmet with some relief. Does not have any vision changes  or neurologic deficits during these headaches. No nausea or vomiting associated with headaches.   See flow sheet for details. Vitals:   12/10/22 1015  BP: 106/63  Pulse: 88   General: well appearing, in no acute distress CV: RRR, radial pulses equal and palpable, no BLE edema  Resp: Normal work of breathing on room air, CTAB Abd: Soft, non tender, non distended, gravid to above umbilicus  Neuro: Alert & Oriented x 4, eyes tracking, moving all extremities without weakness, normal gait,normal fine motor movements    A/P: Pregnancy at [redacted]w[redacted]d.  Doing well.   Dating reviewed, dating tab is correct Fetal heart tones Appropriate Fundal height within expected range.  Anatomy ultrasound reviewed and notable for no concerns.  Influenza vaccine not administered as not influenza season. Marland Kitchen  COVID vaccination was discussed and patient declined .  Indications for screening for preexisting diabetes include: BMI > 25 and high risk ethnicity (Latino, [place] American, Native American, [place] Islander, Asian Naval architect) .  Pregnancy education provided on the following topics: fetal growth and movement, ultrasound assessment, and  upcoming laboratory assessment.   The patient has the following indications for aspirinto begin 81 mg at 12-16 weeks: One high risk condition: no single high risk condition  MORE than one moderate risk condition: obesity and low SES   Aspirin was  recommended today based upon above risk factors (one high risk condition or more than one moderate risk factor)  Preterm labor precautions given.  Patient has history of C-section;  has not yet decided rLTCS or TOLAC.  2. Pregnancy issues include the following and were addressed as appropriate today:   Presyncopal episodes - EKG done in clinic was sinus rhythm without any abnormalities or ectopy. Patient is stable in clinic and appears well hydrated. There is some concern for  possible SVT or ectopy given intermittent nature.  - f/u ECHO - Referral to cardiology  - CMP, CBC, TSH  Headaches - Most likely benign headaches. No red flag symptoms.Unlikely migraines. Encouraged non pharmacologic treatments and recommended Mig-relief.   Problem list and pregnancy box updated: Yes.   Follow up in 1 week  for pre-syncopal episodes and headaches.

## 2022-12-11 ENCOUNTER — Telehealth: Payer: Self-pay | Admitting: Family Medicine

## 2022-12-11 ENCOUNTER — Ambulatory Visit (HOSPITAL_COMMUNITY): Admission: RE | Admit: 2022-12-11 | Payer: Self-pay | Source: Ambulatory Visit

## 2022-12-11 LAB — CBC
Hematocrit: 34.2 % (ref 34.0–46.6)
Hemoglobin: 11.7 g/dL (ref 11.1–15.9)
MCH: 31 pg (ref 26.6–33.0)
MCHC: 34.2 g/dL (ref 31.5–35.7)
MCV: 91 fL (ref 79–97)
Platelets: 220 10*3/uL (ref 150–450)
RBC: 3.78 x10E6/uL (ref 3.77–5.28)
RDW: 12.4 % (ref 11.7–15.4)
WBC: 6.4 10*3/uL (ref 3.4–10.8)

## 2022-12-11 LAB — COMPREHENSIVE METABOLIC PANEL
ALT: 7 IU/L (ref 0–32)
AST: 14 IU/L (ref 0–40)
Albumin: 3.8 g/dL — ABNORMAL LOW (ref 4.0–5.0)
Alkaline Phosphatase: 60 IU/L (ref 44–121)
BUN/Creatinine Ratio: 11 (ref 9–23)
BUN: 5 mg/dL — ABNORMAL LOW (ref 6–20)
Bilirubin Total: 0.3 mg/dL (ref 0.0–1.2)
CO2: 19 mmol/L — ABNORMAL LOW (ref 20–29)
Calcium: 8.9 mg/dL (ref 8.7–10.2)
Chloride: 105 mmol/L (ref 96–106)
Creatinine, Ser: 0.46 mg/dL — ABNORMAL LOW (ref 0.57–1.00)
Globulin, Total: 2.2 g/dL (ref 1.5–4.5)
Glucose: 78 mg/dL (ref 70–99)
Potassium: 3.8 mmol/L (ref 3.5–5.2)
Sodium: 138 mmol/L (ref 134–144)
Total Protein: 6 g/dL (ref 6.0–8.5)
eGFR: 134 mL/min/{1.73_m2} (ref >59)

## 2022-12-11 LAB — TSH RFX ON ABNORMAL TO FREE T4: TSH: 1.45 u[IU]/mL (ref 0.450–4.500)

## 2022-12-11 NOTE — Telephone Encounter (Signed)
Called patient and discussed lab work is normal. She notes feeling normal and that she cancelled the ECHO appt for today as she was told the bill was over $1200. I discussed importance of this ECHO just to rule out anything dangerous and she would like to wait at this time, but discussed return/ED precautions including presyncope, shortness of breath, chest pain, palpitations. She is aware and appreciative of the call.  Burley Saver MD

## 2022-12-14 ENCOUNTER — Other Ambulatory Visit: Payer: Self-pay | Admitting: Family Medicine

## 2022-12-14 DIAGNOSIS — E039 Hypothyroidism, unspecified: Secondary | ICD-10-CM

## 2022-12-14 DIAGNOSIS — Z3492 Encounter for supervision of normal pregnancy, unspecified, second trimester: Secondary | ICD-10-CM

## 2022-12-17 ENCOUNTER — Ambulatory Visit (INDEPENDENT_AMBULATORY_CARE_PROVIDER_SITE_OTHER): Payer: Self-pay | Admitting: Family Medicine

## 2022-12-17 ENCOUNTER — Other Ambulatory Visit: Payer: Self-pay

## 2022-12-17 VITALS — BP 105/64 | HR 96 | Temp 97.7°F | Wt 175.6 lb

## 2022-12-17 DIAGNOSIS — R0600 Dyspnea, unspecified: Secondary | ICD-10-CM

## 2022-12-17 MED ORDER — FAMOTIDINE 20 MG PO TABS
20.0000 mg | ORAL_TABLET | Freq: Every day | ORAL | 2 refills | Status: DC
Start: 2022-12-17 — End: 2023-04-01

## 2022-12-17 NOTE — Patient Instructions (Signed)
It was great seeing you today!  Im glad to see you are doing a little bit better.  I prescribed Pepcid to use daily in case acid reflux is contributing to your chest pressure given that it is happening after you eat.  Please go to MAU if pressure worsens, you have leg swelling, difficult breathing or any new and concerning symptoms   We changed your ultrasound to be at [facility] and is scheduled for 8/2 at 12 PM.  Please check-out at the front desk before leaving the clinic. I'd like to see you back in 3 weeks for your next follow up, but if you need to be seen earlier than that for any new issues we're happy to fit you in, just give [place] a call!  Feel free to call with any questions or concerns at any time, at [phone].   Take care,  Dr. Cora Collum [facility]

## 2022-12-17 NOTE — Progress Notes (Signed)
    SUBJECTIVE:   CHIEF COMPLAINT / HPI:   Patient presents for follow up on her chest/breathing concerns. Patient states no chest pain but a little pressure. Wants to clarify that she would not describe it as painful just difficult to get a full breath in. She feels that with the language barrier it might be confusing because certain words are different. Offered an interpreter but declines. Saturday night after eating felt the difficulty getting a full breath in. Denies feeling like she is gong to pass out. Declines echo due to cost.   Has been using a cooling cap for her head so headaches improved. Has happened a couple times but subsides with Tylenol   Has a growth ultrasound scheduled but requests for it to be done at [facility] due to cost.   Would like c section instead of TOLAC  PERTINENT  PMH / PSH: Reviewed   OBJECTIVE:   BP 105/64   Pulse 96   Temp 97.7 F (36.5 C)   Wt 175 lb 9.6 oz (79.7 kg)   LMP 07/05/2022 (Exact Date)   BMI 30.14 kg/m    Physical exam General: well appearing, NAD Cardiovascular: RRR, no murmurs Lungs: CTAB. Normal WOB Abdomen: soft, non-distended, non-tender Skin: warm, dry. No edema  Fetal HR 146  ASSESSMENT/PLAN:   Dyspnea Patient presents with concern about inability to get a full breath in which typically occurs after a meal. Has been trying to eat small meals but still with this feeling. Denies pre syncope, swelling, pain. Discussed our recommendation to get an echo but she declines due to cost. EKG and labs at last visit unremarkable. Symptoms potentially due to acid reflux. Less likely PE with stable vitals, normal EKG, no LE edema. Will treat for potential GERD with Pepcid daily. Strict ED precautions discussed. She expressed understanding.     Cora Collum, DO [facility]

## 2022-12-17 NOTE — Assessment & Plan Note (Signed)
Patient presents with concern about inability to get a full breath in which typically occurs after a meal. Has been trying to eat small meals but still with this feeling. Denies pre syncope, swelling, pain. Discussed our recommendation to get an echo but she declines due to cost. EKG and labs at last visit unremarkable. Symptoms potentially due to acid reflux. Less likely PE with stable vitals, normal EKG, no LE edema. Will treat for potential GERD with Pepcid daily. Strict ED precautions discussed. She expressed understanding.

## 2023-01-04 ENCOUNTER — Telehealth: Payer: Self-pay

## 2023-01-04 NOTE — Telephone Encounter (Signed)
[facility] calls nurse line requesting a medical clearance letter.   She reports the patient is scheduled to have a regular cleaning, however since she is pregnant they need a letter stating they can perform the cleaning.   She reports we need to state if xrays are permitted if necessary and which medications would be safe for her if needed.   Will forward to provider.   Fax: [phone]

## 2023-01-04 NOTE — Telephone Encounter (Signed)
Letter written, will place at front desk for patient to pick up. Please let her know it's ready.  Thanks! Latrelle Dodrill, MD

## 2023-01-05 NOTE — Telephone Encounter (Signed)
Letter faxed to [facility].

## 2023-01-07 ENCOUNTER — Other Ambulatory Visit: Payer: Self-pay

## 2023-01-07 ENCOUNTER — Ambulatory Visit (INDEPENDENT_AMBULATORY_CARE_PROVIDER_SITE_OTHER): Payer: Self-pay | Admitting: Family Medicine

## 2023-01-07 VITALS — BP 110/63 | HR 103 | Wt 182.6 lb

## 2023-01-07 DIAGNOSIS — Z3482 Encounter for supervision of other normal pregnancy, second trimester: Secondary | ICD-10-CM

## 2023-01-07 DIAGNOSIS — Z3A26 26 weeks gestation of pregnancy: Secondary | ICD-10-CM

## 2023-01-07 DIAGNOSIS — Z98891 History of uterine scar from previous surgery: Secondary | ICD-10-CM

## 2023-01-07 NOTE — Progress Notes (Signed)
[facility] Routine Faculty OB Visit  Wendy Morrison is a 27 y.o. G3P1011 at [redacted]w[redacted]d (via LMP = 19w u/s) who presents for routine follow up.   Denies regular cramping/ctx, fluid leaking, vaginal bleeding, or decreased fetal movement. Having occasional pain in LLQ when walking, a stretching feeling. Her prior symptoms of dizziness, shortness of breath etc have greatly improved.  Delivery/Postpartum Plans: - delivery planning: still undecided about TOLAC vs RCS - circumcision: n/a girl - feeding: both - pediatrician: [facility] - contraception: POP  FHR: 155 bpm Uterine size: 27 cm  Assessment & Plan  1. Routine prenatal care: - LLQ stretching pain w movement consistent with round ligament pain, reassured patient - scheduled all upcoming visits through end of September (through [redacted]w[redacted]d)  2. History of LTCS - refer to [facility] for delivery planning discussion.  3. History of hypothyroidism - normal TSH's thus far in pregnancy - plan for TSH with 28 week labs at next visit - has upcoming growth scan scheduled at [facility] on 8/2 (will cancel MFM u/s as patient prefers to go with cheaper option)  4. Prior presyncopal episodes - much improved, continue to monitor, consider further work up with echo if persists or worsens.  5.  Elevated early 1 hour GTT - mildly elevated on initial early 1 hour GTT with result of 136.  Passed early 3-hour GTT without issue.  I think it is reasonable to have her start with a 1 hour GTT at her next appointment.  She would really like to forego the 3-hour if at all possible -Plan for 1 hour GTT at next appointment  6. Rubella Non Immune - plan for MMR postpartum  Next prenatal visit in 2 weeks.  Will need 1 hour GTT, CBC, HIV, RPR, and Tdap letter for HD at that visit.  MAU precautions discussed.  Levert Feinstein, MD, [facility]

## 2023-01-07 NOTE — Patient Instructions (Signed)
Go to the MAU at [facility] at [facility] if: You have cramping/contractions that do not go away with drinking water Your water breaks.  Sometimes it is a big gush of fluid, sometimes it is just a trickle that keeps getting your underwear wet or running down your legs You have vaginal bleeding.    You do not feel your baby moving like normal.  If you do not, get something to eat and drink and lay down and focus on feeling your baby move. If your baby is still not moving like normal, you should go to MAU.   Plan for 1 hour glucose test at next visit.  Be well, Dr. Pollie Meyer   Second Trimester of Pregnancy  The second trimester of pregnancy is from week 13 through week 27. This is months 4 through 6 of pregnancy. The second trimester is often a time when you feel your best. Your body has adjusted to being pregnant, and you begin to feel better physically. During the second trimester: Morning sickness has lessened or stopped completely. You may have more energy. You may have an increase in appetite. The second trimester is also a time when the unborn baby (fetus) is growing rapidly. At the end of the sixth month, the fetus may be up to 12 inches long and weigh about 1 pounds. You will likely begin to feel the baby move (quickening) between 16 and 20 weeks of pregnancy. Body changes during your second trimester Your body continues to go through many changes during your second trimester. The changes vary and generally return to normal after the baby is born. Physical changes Your weight will continue to increase. You will notice your lower abdomen bulging out. You may begin to get stretch marks on your hips, abdomen, and breasts. Your breasts will continue to grow and to become tender. Dark spots or blotches (chloasma or mask of pregnancy) may develop on your face. A dark line from your belly button to the pubic area (linea nigra) may appear. You may have changes in your hair.  These can include thickening of your hair, rapid growth, and changes in texture. Some people also have hair loss during or after pregnancy, or hair that feels dry or thin. Health changes You may develop headaches. You may have heartburn. You may develop constipation. You may develop hemorrhoids or swollen, bulging veins (varicose veins). Your gums may bleed and may be sensitive to brushing and flossing. You may urinate more often because the fetus is pressing on your bladder. You may have back pain. This is caused by: Weight gain. Pregnancy hormones that are relaxing the joints in your pelvis. A shift in weight and the muscles that support your balance. Follow these instructions at home: Medicines Follow your health care provider's instructions regarding medicine use. Specific medicines may be either safe or unsafe to take during pregnancy. Do not take any medicines unless approved by your health care provider. Take a prenatal vitamin that contains at least 600 micrograms (mcg) of folic acid. Eating and drinking Eat a healthy diet that includes fresh fruits and vegetables, whole grains, good sources of protein such as meat, eggs, or tofu, and low-fat dairy products. Avoid raw meat and unpasteurized juice, milk, and cheese. These carry germs that can harm you and your baby. You may need to take these actions to prevent or treat constipation: Drink enough fluid to keep your urine pale yellow. Eat foods that are high in fiber, such as beans, whole  grains, and fresh fruits and vegetables. Limit foods that are high in fat and processed sugars, such as fried or sweet foods. Activity Exercise only as directed by your health care provider. Most people can continue their usual exercise routine during pregnancy. Try to exercise for 30 minutes at least 5 days a week. Stop exercising if you develop contractions in your uterus. Stop exercising if you develop pain or cramping in the lower abdomen or lower  back. Avoid exercising if it is very hot or humid or if you are at a high altitude. Avoid heavy lifting. If you choose to, you may have sex unless your health care provider tells you not to. Relieving pain and discomfort Wear a supportive bra to prevent discomfort from breast tenderness. Take warm sitz baths to soothe any pain or discomfort caused by hemorrhoids. Use hemorrhoid cream if your health care provider approves. Rest with your legs raised (elevated) if you have leg cramps or low back pain. If you develop varicose veins: Wear support hose as told by your health care provider. Elevate your feet for 15 minutes, 3-4 times a day. Limit salt in your diet. Safety Wear your seat belt at all times when driving or riding in a car. Talk with your health care provider if someone is verbally or physically abusive to you. Lifestyle Do not use hot tubs, steam rooms, or saunas. Do not douche. Do not use tampons or scented sanitary pads. Avoid cat litter boxes and soil used by cats. These carry germs that can cause birth defects in the baby and possibly loss of the fetus by miscarriage or stillbirth. Do not use herbal remedies, alcohol, illegal drugs, or medicines that are not approved by your health care provider. Chemicals in these products can harm your baby. Do not use any products that contain nicotine or tobacco, such as cigarettes, e-cigarettes, and chewing tobacco. If you need help quitting, ask your health care provider. General instructions During a routine prenatal visit, your health care provider will do a physical exam and other tests. He or she will also discuss your overall health. Keep all follow-up visits. This is important. Ask your health care provider for a referral to a local prenatal education class. Ask for help if you have counseling or nutritional needs during pregnancy. Your health care provider can offer advice or refer you to specialists for help with various  needs. Where to find more information American Pregnancy Association: americanpregnancy.org [employer] of Obstetricians and Gynecologists: https://www.todd-brady.net/ Office on [employer] Health: MightyReward.co.nz Contact a health care provider if you have: A headache that does not go away when you take medicine. Vision changes or you see spots in front of your eyes. Mild pelvic cramps, pelvic pressure, or nagging pain in the abdominal area. Persistent nausea, vomiting, or diarrhea. A bad-smelling vaginal discharge or foul-smelling urine. Pain when you urinate. Sudden or extreme swelling of your face, hands, ankles, feet, or legs. A fever. Get help right away if you: Have fluid leaking from your vagina. Have spotting or bleeding from your vagina. Have severe abdominal cramping or pain. Have difficulty breathing. Have chest pain. Have fainting spells. Have not felt your baby move for the time period told by your health care provider. Have new or increased pain, swelling, or redness in an arm or leg. Summary The second trimester of pregnancy is from week 13 through week 27 (months 4 through 6). Do not use herbal remedies, alcohol, illegal drugs, or medicines that are not approved by your  health care provider. Chemicals in these products can harm your baby. Exercise only as directed by your health care provider. Most people can continue their usual exercise routine during pregnancy. Keep all follow-up visits. This is important. This information is not intended to replace advice given to you by your health care provider. Make sure you discuss any questions you have with your health care provider. Document Revised: 11/15/2019 Document Reviewed: 09/21/2019 Elsevier Patient Education  2024 [employer].

## 2023-01-18 ENCOUNTER — Ambulatory Visit: Payer: Self-pay

## 2023-01-20 NOTE — Progress Notes (Signed)
  [facility] Prenatal Visit  Wendy Morrison is a 27 y.o. G3P1011 at [redacted]w[redacted]d here for routine follow up. She is dated by via LMP = 19w u/s.  She reports {symptoms; pregnancy related:14538}. She reports fetal movement. She denies vaginal bleeding, contractions, or loss of fluid. See flow sheet for details.  There were no vitals filed for this visit.    A/P: Pregnancy at [redacted]w[redacted]d.  Doing well.   Routine prenatal care:  Dating reviewed, dating tab is {correct:23336::"correct"} Fetal heart tones {appropriate:23337} Fundal height {fundal height:23342::"within expected range. "} Infant feeding choice: {Breasfeeding:23347::"Breastfeeding"} Contraception choice: {postpartum contraception:23348} Infant circumcision desired {Response; yes/no/na:63}  The patient {CWHCS:23409::"does not have a history of Cesarean delivery and no referral to [facility] is indicated"} Influenza vaccine {given:23340}  Tdap {was/was not:19854::"was"} given today. 1 hour glucola, CBC, RPR, and HIV {WERE / WERE NOT:19253::"were"} obtained today.    Rh status was reviewed and patient {does/does not:19886} need Rhogam.  Rhogam {WAS/WAS NOT:2690322913::"was not"} given today.  Pregnancy medical home and PHQ-9 forms {WERE / WERE NOT:19253} done today and reviewed.   Childbirth and education classes {WERE / WERE OZD:66440} offered. Pregnancy education regarding benefits of breastfeeding, contraception, fetal growth, expected weight gain, and safe infant sleep were discussed.  Preterm labor and fetal movement precautions reviewed.  2. Pregnancy issues include the following and were addressed as appropriate today:   History of LTCS: Refer to [facility] for delivery planning discussion   History of hypothyroidism: TSH normal thus far. Repeat TSH today.  Elevated early 1 hour GTT - mildly elevated on initial early 1 hour GTT with result of 136. Passed early 3-hour GTT without issue. Plan for 1 hour GTT  today.  Rubella Non Immune - plan for MMR postpartum   Problem list and pregnancy box updated: {yes/no:20286::"Yes"}.   Patient scheduled in [facility] during third trimester on ***.  Follow up 2 weeks.

## 2023-01-21 ENCOUNTER — Other Ambulatory Visit: Payer: Self-pay

## 2023-01-21 ENCOUNTER — Ambulatory Visit (INDEPENDENT_AMBULATORY_CARE_PROVIDER_SITE_OTHER): Payer: Self-pay | Admitting: Family Medicine

## 2023-01-21 VITALS — BP 114/70 | HR 97 | Wt 185.0 lb

## 2023-01-21 DIAGNOSIS — O99283 Endocrine, nutritional and metabolic diseases complicating pregnancy, third trimester: Secondary | ICD-10-CM

## 2023-01-21 DIAGNOSIS — Z3493 Encounter for supervision of normal pregnancy, unspecified, third trimester: Secondary | ICD-10-CM

## 2023-01-21 DIAGNOSIS — E039 Hypothyroidism, unspecified: Secondary | ICD-10-CM

## 2023-01-21 NOTE — Patient Instructions (Addendum)
Please make a follow up appointment in 2 weeks on 8/15  Prenatal Classes Go to [email or web address] for more information on the pregnancy and child birth classes that [facility] has to offer.   Pregnancy Related Return Precautions The follow are signs/symptoms that are abnormal in pregnancy and may require further evaluation by a physician: Go to the MAU at [facility] if: You have cramping/contractions that do not go away with drinking water, especially if they are lasting 30 seconds to 1.5 minutes, coming and going every 5-10 minutes for an hour or more, or are getting stronger and you cannot walk or talk while having a contraction/cramp. Your water breaks.  Sometimes it is a big gush of fluid, sometimes it is just a trickle that keeps getting your underwear wet or running down your legs You have vaginal bleeding.    You do not feel your baby moving like normal.  If you do not, get something to eat and drink (something cold or something with sugar like peanut butter or juice) and lay down and focus on feeling your baby move. If your baby is still not moving like normal, you should go to MAU. You should feel your baby move 6 times in one hour, or 10 times in two hours. You have a persistent headache that does not go away with 1 g of Tylenol, vision changes, chest pain, difficulty breathing, severe pain in your right upper abdomen, worsening leg swelling- these can all be signs of high blood pressure in pregnancy and need to be evaluated by a provider immediately  These are all concerning in pregnancy and if you have any of these I recommend you call your PCP and present to the Maternity Admissions Unit (map below) for further evaluation.  For any pregnancy-related emergencies, please go to the Maternity Admissions Unit in the [facility]'s [facility] Center at [facility]. You will use hospital Entrance C.    Our clinic number is  [phone].

## 2023-02-03 NOTE — Progress Notes (Signed)
  [facility] Prenatal Visit  Wendy Morrison is a 27 y.o. G3P1011 at [redacted]w[redacted]d here for routine follow up. She is dated by LMP = 19w u/s.  She reports backache.  She reports fetal movement. She denies vaginal bleeding, contractions, or loss of fluid.  See flow sheet for details.  Vitals:   02/04/23 1347  BP: (!) 102/57  Pulse: 84    Fundal height: 31cm FHR: 158  A/P: Pregnancy at [redacted]w[redacted]d.  Doing well.   Routine prenatal care:  Dating reviewed, dating tab is correct Fetal heart tones: Appropriate - 158 Fundal height: within expected range.  The patient does not have a history of HSV and valacyclovir is not indicated at this time.  The patient has a history of Cesarean delivery and consult to [facility] placed for discussion, signing of consents, and scheduling delivery.  Appointment schedule for 8/27 to discuss Infant feeding choice: Both  Contraception choice: Pills  - start after 3 months once done breast feeding. Infant circumcision desired not applicable Influenza vaccine: not available  Tdap was received at the HD on 8/5. COVID vaccination was discussed, declined. Pregnancy education regarding benefits of breastfeeding, contraception, fetal growth, expected weight gain, and safe infant sleep were discussed.  Preterm labor and fetal movement precautions reviewed.   2. Pregnancy issues include the following and were addressed as appropriate today:   History of LTCS: [facility] appointment scheduled for 8/27 to discuss TOLAC versus repeat C-section.            History of hypothyroidism: TSH normal at last visit.            Prior presyncopal episodes: Resolved. Patient declined Cardiology referral previously.            Elevated early 1 hour GTT - mildly elevated on initial early 1 hour GTT with result of 136. Passed early 3-hour GTT without issue. Passed 1 hour GTT last visit            Rubella Non Immune - plan for MMR postpartum             Third  trimester [place]: normal anatomy, EFW 35%. Breech position, will continue to monitor fetus position closely.  Problem list and pregnancy box updated: Yes.   Schedule for Ob Faculty clinic in third trimester at next visit  Follow up 2 weeks.

## 2023-02-04 ENCOUNTER — Ambulatory Visit (INDEPENDENT_AMBULATORY_CARE_PROVIDER_SITE_OTHER): Payer: Self-pay | Admitting: Family Medicine

## 2023-02-04 VITALS — BP 102/57 | HR 84 | Wt 187.2 lb

## 2023-02-04 DIAGNOSIS — Z3482 Encounter for supervision of other normal pregnancy, second trimester: Secondary | ICD-10-CM

## 2023-02-04 NOTE — Patient Instructions (Signed)
Programe una visita de seguimiento en 2 semanas.   Precauciones de devolucin relacionadas con Arnoldo Lenis  Los siguientes son signos/sntomas que son anormales en el Media planner y pueden requerir una evaluacin adicional por parte de un mdico: Vaya a MAU en [place] en [place] si: Tiene calambres/contracciones que no desaparecen con agua potable, especialmente si duran de 30 segundos a 1,5 minutos, aparecen y desaparecen cada 5-10 minutos durante una hora o ms, o si son cada vez ms fuertes y no puede caminar o Tax inspector. Tu agua se rompe. A veces es un gran chorro de lquido, a veces es solo un goteo que sigue mojando tu ropa interior o corriendo por tus piernas. Tiene sangrado vaginal. No siente que su beb se mueva normalmente. Si no lo hace, busque algo para comer y beber (algo fro o algo con azcar [employer] de man o [place]) y acustese y concntrese en sentir cmo se mueve su beb. Si su beb todava no se mueve con normalidad, debe ir a MAU. Debe sentir que su beb se mueve 6 veces en una hora o 10 veces en dos horas. Tiene un dolor de cabeza persistente que no desaparece con 1 g de Tylenol, cambios en la visin, dolor en el pecho, dificultad para respirar, dolor intenso en la parte superior derecha del abdomen, empeoramiento de la hinchazn de las piernas; todos estos pueden ser signos de presin arterial alta en el embarazo y necesita para ser evaluado por un proveedor inmediatamente  [employer] son preocupantes durante el Media planner y, si tiene alguno de [place], le recomiendo que llame a su PCP y se presente a Geneticist, molecular de Admisiones de Maternidad (mapa a continuacin) para Educational psychologist.  Para cualquier emergencia relacionada con Water quality scientist, dirjase a la [place] de Admisiones de Maternidad en [place] y [facility]. Usar la Entrada C del hospital.

## 2023-02-15 NOTE — Progress Notes (Signed)
  [facility] Prenatal Visit  Wendy Morrison is a 27 y.o. G3P1011 at [redacted]w[redacted]d here for routine follow up. She is dated by LMP = 19w u/s.  She reports Braxton-Hicks regularly.  She reports fetal movement. She denies vaginal bleeding, contractions, or loss of fluid.  See flow sheet for details.  Vitals:   02/18/23 1218  BP: 98/60  Pulse: (!) 117     FHR: 148 Fundal height: 34cm  A/P: Pregnancy at [redacted]w[redacted]d.  Doing well.   Routine prenatal care:  Dating reviewed, dating tab is correct Fetal heart tones: Appropriate - FHR 148 Fundal height: within expected range.  The patient does not have a history of HSV and valacyclovir is not indicated at this time.  The patient has a history of Cesarean delivery and consult to [facility] placed for discussion, signing of consents, and scheduling delivery.  Planning to [facility]. Infant feeding choice: Both  Contraception choice: Pills  Infant circumcision desired not applicable Influenza vaccine not administered as not influenza season.   Tdap: received at HD on 8/5 COVID vaccination was discussed and declined Pregnancy education regarding benefits of breastfeeding, contraception, fetal growth, expected weight gain, and safe infant sleep were discussed.  Preterm labor and fetal movement precautions reviewed.   2. Pregnancy issues include the following and were addressed as appropriate today:            History of LTCS: [facility] appointment on 8/27 with Dr. Debroah Loop. Discussed TOLAC vs repeat C/S and patient opted for TOLAC. Consent signed and brought to the office today. Patient understand risks and would like it known that delivery team should consider C/S if any concerns.            Pica: Reports she feels like eating ice. CBC on 8/1 was normal.  Recommend patient's start taking OTC iron supplementation every other day.            H/o hypothyroidism: TSH normal, no concerns.            Rubella Non-Immune - MMR postpartum              Third trimester [place]: normal anatomy, EFW 35%. Breech position, will continue to monitor fetus position closely.  Problem list and pregnancy box updated: Yes.   Scheduled for Ob Faculty clinic in third trimester on 04/01/2023.   Follow up 2 weeks.

## 2023-02-16 ENCOUNTER — Ambulatory Visit (INDEPENDENT_AMBULATORY_CARE_PROVIDER_SITE_OTHER): Payer: Self-pay | Admitting: Obstetrics & Gynecology

## 2023-02-16 ENCOUNTER — Other Ambulatory Visit: Payer: Self-pay

## 2023-02-16 VITALS — BP 99/68 | HR 101 | Wt 191.4 lb

## 2023-02-16 DIAGNOSIS — Z3483 Encounter for supervision of other normal pregnancy, third trimester: Secondary | ICD-10-CM

## 2023-02-16 DIAGNOSIS — Z98891 History of uterine scar from previous surgery: Secondary | ICD-10-CM

## 2023-02-16 DIAGNOSIS — Z3A32 32 weeks gestation of pregnancy: Secondary | ICD-10-CM

## 2023-02-16 DIAGNOSIS — Z3482 Encounter for supervision of other normal pregnancy, second trimester: Secondary | ICD-10-CM

## 2023-02-16 NOTE — Progress Notes (Signed)
PRENATAL VISIT NOTE  Subjective:  Wendy Morrison is a 27 y.o. G3P1011 at [redacted]w[redacted]d being seen today for ongoing prenatal care.  She is currently monitored for the following issues for this high-risk pregnancy and has Hypothyroidism; Supervision of normal pregnancy in second trimester; Rubella non-immune status, antepartum; and S/P emergency C-section on their problem list.  Patient reports no complaints.  Contractions: Irritability. Vag. Bleeding: None.  Movement: Present. Denies leaking of fluid.   The following portions of the patient's history were reviewed and updated as appropriate: allergies, current medications, past family history, past medical history, past social history, past surgical history and problem list.   Objective:   Vitals:   02/16/23 1422  BP: 99/68  Pulse: (!) 101  Weight: 191 lb 6.4 oz (86.8 kg)    Fetal Status: Fetal Heart Rate (bpm): 140   Movement: Present     General:  Alert, oriented and cooperative. Patient is in no acute distress.  Skin: Skin is warm and dry. No rash noted.   Cardiovascular: Normal heart rate noted  Respiratory: Normal respiratory effort, no problems with respiration noted  Abdomen: Soft, gravid, appropriate for gestational age.  Pain/Pressure: Present     Pelvic: Cervical exam deferred        Extremities: Normal range of motion.  Edema: Trace  Mental Status: Normal mood and affect. Normal behavior. Normal judgment and thought content.   Assessment and Plan:  Pregnancy: G3P1011 at [redacted]w[redacted]d 1. S/P emergency C-section CS emergent 2017 for fetal bradycardia.  Candidate for TOLAC which she desires Faculty Practice OB/GYN Attending Consult Note  27 y.o. G3P1011 at [redacted]w[redacted]d with Estimated Date of Delivery: 04/11/23 was seen today in office to discuss trial of labor after cesarean section (TOLAC) versus elective repeat cesarean delivery (ERCD). The following risks were discussed with the patient.  Risk of uterine rupture at term is 0.78  percent with TOLAC and 0.22 percent with ERCD. 1 in 10 uterine ruptures will result in neonatal death or neurological injury. The benefits of a trial of labor after cesarean (TOLAC) resulting in a vaginal birth after cesarean (VBAC) include the following: shorter length of hospital stay and postpartum recovery (in most cases); fewer complications, such as postpartum fever, wound or uterine infection, thromboembolism (blood clots in the leg or lung), need for blood transfusion and fewer neonatal breathing problems. The risks of an attempted VBAC or TOLAC include the following: Risk of failed trial of labor after cesarean (TOLAC) without a vaginal birth after cesarean (VBAC) resulting in repeat cesarean delivery (RCD) in about 20 to 40 percent of women who attempt VBAC.  Risk of rupture of uterus resulting in an emergency cesarean delivery. The risk of uterine rupture may be related in part to the type of uterine incision made during the first cesarean delivery. A previous transverse uterine incision has the lowest risk of rupture (0.2 to 1.5 percent risk). Vertical or T-shaped uterine incisions have a higher risk of uterine rupture (4 to 9 percent risk)The risk of fetal death is very low with both VBAC and elective repeat cesarean delivery (ERCD), but the likelihood of fetal death is higher with VBAC than with ERCD. Maternal death is very rare with either type of delivery. The risks of an elective repeat cesarean delivery (ERCD) were reviewed with the patient including but not limited to: 07/998 risk of uterine rupture which could have serious consequences, bleeding which may require transfusion; infection which may require antibiotics; injury to bowel, bladder or other surrounding organs (bowel,  bladder, ureters); injury to the fetus; need for additional procedures including hysterectomy in the event of a life-threatening hemorrhage; thromboembolic phenomenon; abnormal placentation; incisional problems; death  and other postoperative or anesthesia complications.    These risks and benefits are summarized on the consent form, which was reviewed with the patient during the visit.  All her questions answered and she signed a consent indicating a preference for TOLAC/ERCD. A copy of the consent was given to the patient.  (Will continue routine postpartum care.     2. Encounter for supervision of other normal pregnancy in second trimester [redacted]w[redacted]d   Preterm labor symptoms and general obstetric precautions including but not limited to vaginal bleeding, contractions, leaking of fluid and fetal movement were reviewed in detail with the patient. Please refer to After Visit Summary for other counseling recommendations.   Return in about 2 days (around 02/18/2023).  Future Appointments  Date Time Provider Department Center  02/18/2023  8:55 AM Elberta Fortis, MD [facility]  03/05/2023  1:30 PM Elberta Fortis, MD [facility]  03/16/2023  8:30 AM Elberta Fortis, MD [facility]    Scheryl Darter, MD

## 2023-02-18 ENCOUNTER — Ambulatory Visit (INDEPENDENT_AMBULATORY_CARE_PROVIDER_SITE_OTHER): Payer: Self-pay | Admitting: Family Medicine

## 2023-02-18 VITALS — BP 98/60 | HR 117 | Wt 195.0 lb

## 2023-02-18 DIAGNOSIS — Z3483 Encounter for supervision of other normal pregnancy, third trimester: Secondary | ICD-10-CM

## 2023-02-18 NOTE — Patient Instructions (Signed)
Programe una visita de seguimiento en 2 semanas.  You can switch to a prenatal gummy if that settles better with your stomach.   I would recommend starting iron supplementation 3 times per week. You can get it at the [facility] next door for a good price. If you feel it constipates you too much you can reduce how much you take it.  Prenatal Classes Go to [email or web address]  Precauciones de devolucin relacionadas con Wendy Morrison  Los siguientes son signos/sntomas que son anormales en el Psychiatrist y pueden requerir una evaluacin adicional por parte de un mdico: Vaya a MAU en [facility] [place] si: Tiene calambres/contracciones que no desaparecen con agua potable, especialmente si duran de 30 segundos a 1,5 minutos, aparecen y desaparecen cada 5-10 minutos durante una hora o ms, o si son cada vez ms fuertes y no puede caminar o Physiological scientist. Tu agua se rompe. A veces es un gran chorro de lquido, a veces es solo un goteo que sigue mojando tu ropa interior o corriendo por tus piernas. Tiene sangrado vaginal. No siente que su beb se mueva normalmente. Si no lo hace, busque algo para comer y beber (algo fro o algo con azcar [employer] de man o [place]) y acustese y concntrese en sentir cmo se mueve su beb. Si su beb todava no se mueve con normalidad, debe ir a MAU. Debe sentir que su beb se mueve 6 veces en una hora o 10 veces en dos horas. Tiene un dolor de cabeza persistente que no desaparece con 1 g de Tylenol, cambios en la visin, dolor en el pecho, dificultad para respirar, dolor intenso en la parte superior derecha del abdomen, empeoramiento de la hinchazn de las piernas; todos estos pueden ser signos de presin arterial alta en el embarazo y necesita para ser evaluado por un proveedor inmediatamente  [place] son preocupantes durante el Psychiatrist y, si tiene alguno de [place], le  recomiendo que llame a su PCP y se presente a Furniture conservator/restorer de Admisiones de Maternidad (mapa a continuacin) para Hotel manager.  Para cualquier emergencia relacionada con Firefighter, dirjase a la [place] de Admisiones de Maternidad en [address] de [place] y [facility]. Usar la Entrada C del hospital.

## 2023-02-24 ENCOUNTER — Encounter: Payer: Self-pay | Admitting: Family Medicine

## 2023-02-24 DIAGNOSIS — O98513 Other viral diseases complicating pregnancy, third trimester: Secondary | ICD-10-CM

## 2023-02-25 MED ORDER — PAXLOVID (300/100) 20 X 150 MG & 10 X 100MG PO TBPK
ORAL_TABLET | ORAL | 0 refills | Status: DC
Start: 2023-02-25 — End: 2023-02-26

## 2023-02-25 NOTE — Telephone Encounter (Signed)
Spoke with patient over the phone about symptoms. Around 2 days of fever, sore throat, cough and congestion. Other sick contacts in the home. States she has a COVID test coming in the mail today. Advised patient to let the clinic know if she is positive and would like Paxlovid treatment and I will send it to the pharmacy. Sent MyChart message with pregnancy safe medication and recommendation. Follow up as needed next week for persistent symptoms.  Elberta Fortis, DO

## 2023-02-25 NOTE — Addendum Note (Signed)
Addended by: Elberta Fortis on: 02/25/2023 02:05 PM   Modules accepted: Orders

## 2023-02-26 MED ORDER — PAXLOVID (300/100) 20 X 150 MG & 10 X 100MG PO TBPK: ORAL_TABLET | ORAL | 0 refills | Status: DC

## 2023-02-26 NOTE — Addendum Note (Signed)
Addended by: Steva Colder on: 02/26/2023 09:33 AM   Modules accepted: Orders

## 2023-03-05 ENCOUNTER — Other Ambulatory Visit: Payer: Self-pay

## 2023-03-05 ENCOUNTER — Ambulatory Visit (INDEPENDENT_AMBULATORY_CARE_PROVIDER_SITE_OTHER): Payer: Self-pay | Admitting: Family Medicine

## 2023-03-05 VITALS — BP 103/63 | HR 87 | Temp 98.2°F | Wt 194.4 lb

## 2023-03-05 DIAGNOSIS — Z3483 Encounter for supervision of other normal pregnancy, third trimester: Secondary | ICD-10-CM
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
## 2023-03-05 NOTE — Patient Instructions (Signed)
Programe una visita de seguimiento en 2 semanas.  Prenatal Classes Go to [email or web address]  Precauciones de devolucin relacionadas con Reginia Naas  Los siguientes son signos/sntomas que son anormales en el Psychiatrist y pueden requerir una evaluacin adicional por parte de un mdico: Vaya a MAU en [facility] [place] si: Tiene calambres/contracciones que no desaparecen con agua potable, especialmente si duran de 30 segundos a 1,5 minutos, aparecen y desaparecen cada 5-10 minutos durante una hora o ms, o si son cada vez ms fuertes y no puede caminar o Physiological scientist. Tu agua se rompe. A veces es un gran chorro de lquido, a veces es solo un goteo que sigue mojando tu ropa interior o corriendo por tus piernas. Tiene sangrado vaginal. No siente que su beb se mueva normalmente. Si no lo hace, busque algo para comer y beber (algo fro o algo con azcar [employer] de man o [place]) y acustese y concntrese en sentir cmo se mueve su beb. Si su beb todava no se mueve con normalidad, debe ir a MAU. Debe sentir que su beb se mueve 6 veces en una hora o 10 veces en dos horas. Tiene un dolor de cabeza persistente que no desaparece con 1 g de Tylenol, cambios en la visin, dolor en el pecho, dificultad para respirar, dolor intenso en la parte superior derecha del abdomen, empeoramiento de la hinchazn de las piernas; todos estos pueden ser signos de presin arterial alta en el embarazo y necesita para ser evaluado por un proveedor inmediatamente  [place] son preocupantes durante el Psychiatrist y, si tiene alguno de [place], le recomiendo que llame a su PCP y se presente a Furniture conservator/restorer de Admisiones de Maternidad (mapa a continuacin) para Hotel manager.  Para cualquier emergencia relacionada con Firefighter, dirjase a la [place] de Admisiones de Maternidad en [address] de [place] y [facility]. Usar la Entrada C del hospital.

## 2023-03-12 NOTE — Progress Notes (Unsigned)
  [facility] Prenatal Visit  Wendy Morrison is a 26 y.o. G3P1011 at [redacted]w[redacted]d here for routine follow up. She is dated by {Ob dating:14516}.  She reports {symptoms; pregnancy related:14538}. She reports fetal movement. She denies vaginal bleeding, contractions, or loss of fluid. See flow sheet for details.  There were no vitals filed for this visit.  Plan to obtain GBS, G/C and obtain bedside ultrasound to determine position at follow-up visit.   A/P: Pregnancy at [redacted]w[redacted]d.  Doing well.   Routine prenatal care  Dating reviewed, dating tab is {correct:23336::"correct"} Fetal heart tones {appropriate:23337} Fundal height {fundal height:23342::"within expected range. "} Fetal position confirmed {vertex:23350::"Vertex"} using {ultrasound or [place]:23351::"Ultrasound "}.  GBS {collected today :23349::"collected today. "}.  Repeat GC/CT {collected today :23349::"collected today. "} The patient {does/does not:200015::"does not"} have a history of HSV and valacyclovir {ACTION; IS/IS NOT:21021397::"is not"} indicated at this time.  Infant feeding choice: {Breasfeeding:23347::"Breastfeeding"} Contraception choice: {postpartum contraception:23348} Infant circumcision desired {Response; yes/no/na:63} Influenza vaccine {given:23340}  Tdap {Tdap:23345::"previously administered between 27-36 weeks "} COVID vaccination was discussed and ***.  Pregnancy education regarding preterm labor, fetal movement,  benefits of breastfeeding, contraception, fetal growth, expected weight gain, and safe infant sleep were discussed.    2. Pregnancy issues include the following and were addressed as appropriate today:   ***  Problem list and pregnancy box updated: {yes/no:20286::"Yes"}.  Follow up 1 week.

## 2023-03-16 ENCOUNTER — Other Ambulatory Visit (HOSPITAL_COMMUNITY)
Admission: RE | Admit: 2023-03-16 | Discharge: 2023-03-16 | Disposition: A | Discharge status: Home / Self Care | Payer: Self-pay | Source: Ambulatory Visit | Attending: Family Medicine | Admitting: Family Medicine

## 2023-03-16 ENCOUNTER — Ambulatory Visit (INDEPENDENT_AMBULATORY_CARE_PROVIDER_SITE_OTHER): Payer: Self-pay | Admitting: Family Medicine

## 2023-03-16 VITALS — BP 100/75 | HR 105 | Temp 97.9°F | Wt 198.4 lb

## 2023-03-16 DIAGNOSIS — Z3483 Encounter for supervision of other normal pregnancy, third trimester: Secondary | ICD-10-CM

## 2023-03-16 NOTE — Patient Instructions (Addendum)
Programe una visita de seguimiento en 1 semanas. We will check her position again and decided if you may need a c-section for delivery.  Prenatal Classes Go to [email or web address]  Precauciones de devolucin relacionadas con Reginia Naas  Los siguientes son signos/sntomas que son anormales en el Psychiatrist y pueden requerir una evaluacin adicional por parte de un mdico: Vaya a MAU en [facility] [place] si: Tiene calambres/contracciones que no desaparecen con agua potable, especialmente si duran de 30 segundos a 1,5 minutos, aparecen y desaparecen cada 5-10 minutos durante una hora o ms, o si son cada vez ms fuertes y no puede caminar o Physiological scientist. Tu agua se rompe. A veces es un gran chorro de lquido, a veces es solo un goteo que sigue mojando tu ropa interior o corriendo por tus piernas. Tiene sangrado vaginal. No siente que su beb se mueva normalmente. Si no lo hace, busque algo para comer y beber (algo fro o algo con azcar [employer] de man o [place]) y acustese y concntrese en sentir cmo se mueve su beb. Si su beb todava no se mueve con normalidad, debe ir a MAU. Debe sentir que su beb se mueve 6 veces en una hora o 10 veces en dos horas. Tiene un dolor de cabeza persistente que no desaparece con 1 g de Tylenol, cambios en la visin, dolor en el pecho, dificultad para respirar, dolor intenso en la parte superior derecha del abdomen, empeoramiento de la hinchazn de las piernas; todos estos pueden ser signos de presin arterial alta en el embarazo y necesita para ser evaluado por un proveedor inmediatamente  [place] son preocupantes durante el Psychiatrist y, si tiene alguno de [place], le recomiendo que llame a su PCP y se presente a Furniture conservator/restorer de Admisiones de Maternidad (mapa a continuacin) para Hotel manager.  Para cualquier emergencia relacionada con Firefighter, dirjase a  la [place] de Admisiones de Maternidad en [address] de [place] y [facility]. Usar la Entrada C del hospital.

## 2023-03-17 LAB — CERVICOVAGINAL ANCILLARY ONLY
Chlamydia: NEGATIVE
Comment: NEGATIVE
Comment: NORMAL
Neisseria Gonorrhea: NEGATIVE

## 2023-03-18 ENCOUNTER — Encounter: Payer: Self-pay | Admitting: Family Medicine

## 2023-03-20 LAB — CULTURE, BETA STREP (GROUP B ONLY): Strep Gp B Culture: NEGATIVE

## 2023-03-22 ENCOUNTER — Encounter (HOSPITAL_COMMUNITY): Payer: Self-pay

## 2023-03-22 NOTE — Telephone Encounter (Signed)
Patient calls nurse line regarding concern. She states that she was speaking with someone earlier today regarding rescheduling and was told that provider would need to change the date.   Patient states that she "really wants to have c-section moved to later date."   She has follow up appointment with our office on 03/26/23.  Will forward to PCP.   Veronda Prude, RN

## 2023-03-22 NOTE — Patient Instructions (Signed)
Wendy Morrison  03/22/2023   Your procedure is scheduled on:  04/04/2023  Arrive at 0730 at [employer] [facility] on [address] at [address]  and [employer]. You are invited to use the FREE valet parking or use the Visitor's parking deck.  Pick up the phone at the desk and dial [phone].  Call this number if you have problems the morning of surgery: [phone]  Remember:   Do not eat food:(After Midnight) Desps de medianoche.  Do not drink clear liquids: (After Midnight) Desps de medianoche.  Take these medicines the morning of surgery with A SIP OF WATER:  none   Do not wear jewelry, make-up or nail polish.  Do not wear lotions, powders, or perfumes. Do not wear deodorant.  Do not shave 48 hours prior to surgery.  Do not bring valuables to the hospital.  [facility] is not   responsible for any belongings or valuables brought to the hospital.  Contacts, dentures or bridgework may not be worn into surgery.  Leave suitcase in the car. After surgery it may be brought to your room.  For patients admitted to the hospital, checkout time is 11:00 AM the day of              discharge.      Please read over the following fact sheets that you were given:     Preparing for Surgery

## 2023-03-25 NOTE — Progress Notes (Signed)
  [facility] Prenatal Visit  Wendy Morrison is a 27 y.o. G3P1011 at [redacted]w[redacted]d here for routine follow up. She is dated by LMP = 19w u/s.  She reports backache and occasional contractions. She reports fetal movement. She denies vaginal bleeding, contractions, or loss of fluid. See flow sheet for details.  There were no vitals filed for this visit.  A/P: Pregnancy at [redacted]w[redacted]d.  Doing well.   Routine prenatal care  Dating reviewed, dating tab is correct Fetal heart tones Appropriate - 142 Fundal height within expected range.  Fetal position confirmed transverse/breech using Ultrasound at last visit and confirmed same position at this visit GBS collected last visit. Negative  The patient does not have a history of HSV and valacyclovir is not indicated at this time.  Infant feeding choice: Both  Contraception choice: Pills  Infant circumcision desired not applicable Influenza vaccine: given   Tdap previously administered between 27-36 weeks  COVID vaccination was discussed and declined.  Abrysvo RSV vaccine was not discussed today as patient is over [redacted] weeks pregnant Pregnancy education regarding preterm labor, fetal movement,  benefits of breastfeeding, contraception, fetal growth, expected weight gain, and safe infant sleep were discussed.    2. Pregnancy issues include the following and were addressed as appropriate today:             Fetal malposition: Fetus again confirmed transverse/breech at this appointment.  Patient again declined ECV.  Will most likely need to proceed with C-section, reach out to [facility] scheduler to see if it can be scheduled for 10/18.  Awaiting their response  Anxiety: Patient reports her anxiety has significantly worsened since learning baby was transverse/breech at last appointment.  C-section currently scheduled for 10/13 and patient requesting rescheduling to 10/18. Patient is seeing therapist and has good support system at home with mother and  husband.              H/o hypothyroidism: TSH normal, no concerns.            Rubella Non-Immune - MMR postpartum   Problem list and pregnancy box updated: Yes.  Follow up 1 week.

## 2023-03-26 ENCOUNTER — Other Ambulatory Visit: Payer: Self-pay

## 2023-03-26 ENCOUNTER — Ambulatory Visit (INDEPENDENT_AMBULATORY_CARE_PROVIDER_SITE_OTHER): Payer: Self-pay | Admitting: Family Medicine

## 2023-03-26 VITALS — BP 111/73 | HR 89 | Temp 98.1°F | Wt 200.6 lb

## 2023-03-26 DIAGNOSIS — Z3483 Encounter for supervision of other normal pregnancy, third trimester: Secondary | ICD-10-CM

## 2023-03-26 NOTE — Telephone Encounter (Signed)
Attempted to contact [facility] to discuss C-section scheduling.  Unable to reach scheduler but left message to discuss moving surgery from 10/13 to 10/18.  Will continue discussed with patient at upcoming appointment.  Elberta Fortis, DO

## 2023-03-31 ENCOUNTER — Other Ambulatory Visit: Payer: Self-pay | Admitting: Obstetrics and Gynecology

## 2023-04-01 ENCOUNTER — Ambulatory Visit (INDEPENDENT_AMBULATORY_CARE_PROVIDER_SITE_OTHER): Payer: Medicaid Other | Admitting: Family Medicine

## 2023-04-01 VITALS — BP 111/69 | HR 67 | Wt 200.2 lb

## 2023-04-01 DIAGNOSIS — Z3483 Encounter for supervision of other normal pregnancy, third trimester: Secondary | ICD-10-CM | POA: Diagnosis not present

## 2023-04-01 DIAGNOSIS — Z3A38 38 weeks gestation of pregnancy: Secondary | ICD-10-CM
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
## 2023-04-01 NOTE — Patient Instructions (Signed)
It was great to see you again today.  Get flu shot as soon as possible Plan for baby to get nirsevimab  Go to the MAU at [facility] at [facility] if: You begin to have strong, frequent contractions Your water breaks.  Sometimes it is a big gush of fluid, sometimes it is just a trickle that keeps getting your underwear wet or running down your legs You have vaginal bleeding.  It is normal to have a small amount of spotting if your cervix was checked.  You do not feel your baby moving like normal.  If you do not, get something to eat and drink and lay down and focus on feeling your baby move.   If your baby is still not moving like normal, you should go to MAU.   Be well, Dr. Pollie Meyer

## 2023-04-02 ENCOUNTER — Encounter (HOSPITAL_COMMUNITY)
Admission: RE | Admit: 2023-04-02 | Discharge: 2023-04-02 | Disposition: A | Discharge status: Home / Self Care | Payer: Self-pay | Source: Ambulatory Visit | Attending: Obstetrics and Gynecology | Admitting: Obstetrics and Gynecology

## 2023-04-02 HISTORY — DX: Family history of other specified conditions: Z84.89

## 2023-04-04 DIAGNOSIS — Z98891 History of uterine scar from previous surgery: Secondary | ICD-10-CM

## 2023-04-04 DIAGNOSIS — Z3483 Encounter for supervision of other normal pregnancy, third trimester: Secondary | ICD-10-CM

## 2023-04-05 NOTE — Patient Instructions (Signed)
Lyrik Dockstader  04/05/2023   Your procedure is scheduled on:  04/08/2023  Arrive at 0730 at [employer] [facility] on [address] at [facility]  and [employer]. You are invited to use the FREE valet parking or use the Visitor's parking deck.  Pick up the phone at the desk and dial [phone].  Call this number if you have problems the morning of surgery: [phone]  Remember:   Do not eat food:(After Midnight) Desps de medianoche.  Do not drink clear liquids: (After Midnight) Desps de medianoche.  Take these medicines the morning of surgery with A SIP OF WATER:  none   Do not wear jewelry, make-up or nail polish.  Do not wear lotions, powders, or perfumes. Do not wear deodorant.  Do not shave 48 hours prior to surgery.  Do not bring valuables to the hospital.  [facility] is not   responsible for any belongings or valuables brought to the hospital.  Contacts, dentures or bridgework may not be worn into surgery.  Leave suitcase in the car. After surgery it may be brought to your room.  For patients admitted to the hospital, checkout time is 11:00 AM the day of              discharge.      Please read over the following fact sheets that you were given:     Preparing for Surgery

## 2023-04-06 ENCOUNTER — Encounter (HOSPITAL_COMMUNITY)
Admission: RE | Admit: 2023-04-06 | Discharge: 2023-04-06 | Disposition: A | Discharge status: Home / Self Care | Payer: Self-pay | Source: Ambulatory Visit | Attending: Obstetrics and Gynecology | Admitting: Obstetrics and Gynecology

## 2023-04-06 DIAGNOSIS — Z98891 History of uterine scar from previous surgery: Secondary | ICD-10-CM

## 2023-04-06 DIAGNOSIS — Z3483 Encounter for supervision of other normal pregnancy, third trimester: Secondary | ICD-10-CM

## 2023-04-06 DIAGNOSIS — Z3A38 38 weeks gestation of pregnancy: Secondary | ICD-10-CM | POA: Insufficient documentation

## 2023-04-06 DIAGNOSIS — Z01812 Encounter for preprocedural laboratory examination: Secondary | ICD-10-CM | POA: Insufficient documentation

## 2023-04-06 LAB — CBC
HCT: 34 % — ABNORMAL LOW (ref 36.0–46.0)
Hemoglobin: 11.1 g/dL — ABNORMAL LOW (ref 12.0–15.0)
MCH: 26.7 pg (ref 26.0–34.0)
MCHC: 32.6 g/dL (ref 30.0–36.0)
MCV: 81.9 fL (ref 80.0–100.0)
Platelets: 281 10*3/uL (ref 150–400)
RBC: 4.15 MIL/uL (ref 3.87–5.11)
RDW: 13.4 % (ref 11.5–15.5)
WBC: 6.6 10*3/uL (ref 4.0–10.5)
nRBC: 0 % (ref 0.0–0.2)

## 2023-04-06 LAB — TYPE AND SCREEN
ABO/RH(D): B POS
Antibody Screen: NEGATIVE

## 2023-04-07 ENCOUNTER — Telehealth (HOSPITAL_COMMUNITY): Payer: Self-pay | Admitting: *Deleted

## 2023-04-07 LAB — RPR: RPR Ser Ql: NONREACTIVE

## 2023-04-07 NOTE — Telephone Encounter (Signed)
Message left with new instructions of clear liquids until time af arrival.

## 2023-04-07 NOTE — Anesthesia Preprocedure Evaluation (Signed)
Anesthesia Evaluation  Patient identified by MRN, date of birth, ID band Patient awake    Reviewed: Allergy & Precautions, NPO status , Patient's Chart, lab work & pertinent test results  Airway Mallampati: II  TM Distance: >3 FB Neck ROM: Full    Dental no notable dental hx.    Pulmonary neg pulmonary ROS   Pulmonary exam normal        Cardiovascular negative cardio ROS  Rhythm:Regular Rate:Normal     Neuro/Psych negative neurological ROS  negative psych ROS   GI/Hepatic negative GI ROS, Neg liver ROS,,,  Endo/Other  Hypothyroidism    Renal/GU negative Renal ROS  negative genitourinary   Musculoskeletal negative musculoskeletal ROS (+)    Abdominal Normal abdominal exam  (+)   Peds  Hematology Lab Results      Component                Value               Date                      WBC                      6.6                 04/06/2023                HGB                      11.1 (L)            04/06/2023                HCT                      34.0 (L)            04/06/2023                MCV                      81.9                04/06/2023                PLT                      281                 04/06/2023             Lab Results      Component                Value               Date                      NA                       138                 12/10/2022                K                        3.8  12/10/2022                CO2                      19 (L)              12/10/2022                GLUCOSE                  78                  12/10/2022                BUN                      5 (L)               12/10/2022                CREATININE               0.46 (L)            12/10/2022                CALCIUM                  8.9                 12/10/2022                EGFR                     134                 12/10/2022                GFRNONAA                 >89                  12/10/2015              Anesthesia Other Findings   Reproductive/Obstetrics (+) Pregnancy                             Anesthesia Physical Anesthesia Plan  ASA: 2  Anesthesia Plan: Spinal   Post-op Pain Management:    Induction:   PONV Risk Score and Plan: 2 and Ondansetron, Dexamethasone and Treatment may vary due to age or medical condition  Airway Management Planned: Natural Airway  Additional Equipment: None  Intra-op Plan:   Post-operative Plan:   Informed Consent: I have reviewed the patients History and Physical, chart, labs and discussed the procedure including the risks, benefits and alternatives for the proposed anesthesia with the patient or authorized representative who has indicated his/her understanding and acceptance.     Dental advisory given  Plan Discussed with: CRNA  Anesthesia Plan Comments:        Anesthesia Quick Evaluation

## 2023-04-08 ENCOUNTER — Inpatient Hospital Stay (HOSPITAL_COMMUNITY)
Admission: AD | Admit: 2023-04-08 | Discharge: 2023-04-11 | DRG: 787 | Disposition: A | Discharge status: Home / Self Care | Payer: MEDICAID | Attending: Obstetrics and Gynecology | Admitting: Obstetrics and Gynecology

## 2023-04-08 ENCOUNTER — Encounter (HOSPITAL_COMMUNITY): Payer: Self-pay | Admitting: Obstetrics and Gynecology

## 2023-04-08 ENCOUNTER — Other Ambulatory Visit: Payer: Self-pay

## 2023-04-08 ENCOUNTER — Encounter (HOSPITAL_COMMUNITY)
Admission: AD | Disposition: A | Discharge status: Home / Self Care | Payer: Self-pay | Source: Home / Self Care | Attending: Obstetrics and Gynecology

## 2023-04-08 ENCOUNTER — Inpatient Hospital Stay (HOSPITAL_COMMUNITY): Payer: MEDICAID | Admitting: Anesthesiology

## 2023-04-08 ENCOUNTER — Inpatient Hospital Stay (HOSPITAL_COMMUNITY): Payer: Medicaid Other | Admitting: Anesthesiology

## 2023-04-08 DIAGNOSIS — Z806 Family history of leukemia: Secondary | ICD-10-CM | POA: Diagnosis not present

## 2023-04-08 DIAGNOSIS — O34211 Maternal care for low transverse scar from previous cesarean delivery: Secondary | ICD-10-CM

## 2023-04-08 DIAGNOSIS — Z7982 Long term (current) use of aspirin: Secondary | ICD-10-CM

## 2023-04-08 DIAGNOSIS — D62 Acute posthemorrhagic anemia: Secondary | ICD-10-CM | POA: Diagnosis not present

## 2023-04-08 DIAGNOSIS — E039 Hypothyroidism, unspecified: Secondary | ICD-10-CM | POA: Diagnosis present

## 2023-04-08 DIAGNOSIS — O34219 Maternal care for unspecified type scar from previous cesarean delivery: Secondary | ICD-10-CM | POA: Diagnosis not present

## 2023-04-08 DIAGNOSIS — Z3490 Encounter for supervision of normal pregnancy, unspecified, unspecified trimester: Secondary | ICD-10-CM

## 2023-04-08 DIAGNOSIS — O321XX0 Maternal care for breech presentation, not applicable or unspecified: Secondary | ICD-10-CM | POA: Diagnosis present

## 2023-04-08 DIAGNOSIS — Z91018 Allergy to other foods: Secondary | ICD-10-CM

## 2023-04-08 DIAGNOSIS — O99284 Endocrine, nutritional and metabolic diseases complicating childbirth: Secondary | ICD-10-CM

## 2023-04-08 DIAGNOSIS — Z8249 Family history of ischemic heart disease and other diseases of the circulatory system: Secondary | ICD-10-CM

## 2023-04-08 DIAGNOSIS — Z833 Family history of diabetes mellitus: Secondary | ICD-10-CM | POA: Diagnosis not present

## 2023-04-08 DIAGNOSIS — Z880 Allergy status to penicillin: Secondary | ICD-10-CM

## 2023-04-08 DIAGNOSIS — K5900 Constipation, unspecified: Secondary | ICD-10-CM | POA: Diagnosis present

## 2023-04-08 DIAGNOSIS — Z98891 History of uterine scar from previous surgery: Principal | ICD-10-CM

## 2023-04-08 DIAGNOSIS — Z3A00 Weeks of gestation of pregnancy not specified: Secondary | ICD-10-CM | POA: Diagnosis not present

## 2023-04-08 DIAGNOSIS — O9081 Anemia of the puerperium: Secondary | ICD-10-CM | POA: Diagnosis not present

## 2023-04-08 DIAGNOSIS — Z3483 Encounter for supervision of other normal pregnancy, third trimester: Secondary | ICD-10-CM

## 2023-04-08 DIAGNOSIS — Z2839 Other underimmunization status: Secondary | ICD-10-CM

## 2023-04-08 DIAGNOSIS — Z3A39 39 weeks gestation of pregnancy: Secondary | ICD-10-CM

## 2023-04-08 SURGERY — Surgical Case
Anesthesia: Spinal

## 2023-04-08 MED ORDER — SODIUM CHLORIDE 0.9% FLUSH: 3.0000 mL | INTRAVENOUS | Status: DC | PRN

## 2023-04-08 MED ORDER — LACTATED RINGERS IV SOLN: INTRAVENOUS | Status: DC

## 2023-04-08 MED ORDER — MORPHINE SULFATE (PF) 0.5 MG/ML IJ SOLN
INTRAMUSCULAR | Status: AC
  Filled 2023-04-08: qty 10

## 2023-04-08 MED ORDER — OXYTOCIN-SODIUM CHLORIDE 30-0.9 UT/500ML-% IV SOLN
INTRAVENOUS | Status: DC | PRN
  Administered 2023-04-08: 300 mL via INTRAVENOUS

## 2023-04-08 MED ORDER — PRENATAL MULTIVITAMIN CH
1.0000 | ORAL_TABLET | Freq: Every day | ORAL | Status: DC
  Administered 2023-04-08 – 2023-04-11 (×4): 1 via ORAL
  Filled 2023-04-08 (×4): qty 1

## 2023-04-08 MED ORDER — DEXMEDETOMIDINE HCL IN NACL 80 MCG/20ML IV SOLN
INTRAVENOUS | Status: DC | PRN
Start: 2023-04-08 — End: 2023-04-08
  Administered 2023-04-08 (×6): 8 ug via INTRAVENOUS

## 2023-04-08 MED ORDER — DEXAMETHASONE SODIUM PHOSPHATE 10 MG/ML IJ SOLN
INTRAMUSCULAR | Status: AC
  Filled 2023-04-08: qty 2

## 2023-04-08 MED ORDER — FENTANYL CITRATE (PF) 100 MCG/2ML IJ SOLN
INTRAMUSCULAR | Status: AC
  Filled 2023-04-08: qty 2

## 2023-04-08 MED ORDER — ACETAMINOPHEN 10 MG/ML IV SOLN
INTRAVENOUS | Status: DC | PRN
  Administered 2023-04-08: 1000 mg via INTRAVENOUS

## 2023-04-08 MED ORDER — OXYCODONE HCL 5 MG PO TABS
5.0000 mg | ORAL_TABLET | ORAL | Status: DC | PRN
  Administered 2023-04-08 – 2023-04-10 (×5): 5 mg via ORAL
  Filled 2023-04-08 (×6): qty 1

## 2023-04-08 MED ORDER — STERILE WATER FOR IRRIGATION IR SOLN
Status: DC | PRN
  Administered 2023-04-08: 1

## 2023-04-08 MED ORDER — NALOXONE HCL 0.4 MG/ML IJ SOLN: 0.4000 mg | INTRAMUSCULAR | Status: DC | PRN

## 2023-04-08 MED ORDER — MENTHOL 3 MG MT LOZG: 1.0000 | LOZENGE | OROMUCOSAL | Status: DC | PRN

## 2023-04-08 MED ORDER — MEPERIDINE HCL 25 MG/ML IJ SOLN: 6.2500 mg | INTRAMUSCULAR | Status: DC | PRN

## 2023-04-08 MED ORDER — FENTANYL CITRATE (PF) 100 MCG/2ML IJ SOLN
INTRAMUSCULAR | Status: DC | PRN
  Administered 2023-04-08: 15 ug via INTRATHECAL

## 2023-04-08 MED ORDER — ZOLPIDEM TARTRATE 5 MG PO TABS: 5.0000 mg | ORAL_TABLET | Freq: Every evening | ORAL | Status: DC | PRN

## 2023-04-08 MED ORDER — KETOROLAC TROMETHAMINE 30 MG/ML IJ SOLN
INTRAMUSCULAR | Status: AC
  Filled 2023-04-08: qty 1

## 2023-04-08 MED ORDER — DIPHENHYDRAMINE HCL 25 MG PO CAPS: 25.0000 mg | ORAL_CAPSULE | Freq: Four times a day (QID) | ORAL | Status: DC | PRN

## 2023-04-08 MED ORDER — ACETAMINOPHEN 500 MG PO TABS: 1000.0000 mg | ORAL_TABLET | ORAL | Status: DC

## 2023-04-08 MED ORDER — KETOROLAC TROMETHAMINE 30 MG/ML IJ SOLN
30.0000 mg | Freq: Once | INTRAMUSCULAR | Status: AC | PRN
  Administered 2023-04-08: 30 mg via INTRAVENOUS

## 2023-04-08 MED ORDER — SCOPOLAMINE 1 MG/3DAYS TD PT72
1.0000 | MEDICATED_PATCH | TRANSDERMAL | Status: DC
  Administered 2023-04-08: 1.5 mg via TRANSDERMAL

## 2023-04-08 MED ORDER — COCONUT OIL OIL: 1.0000 | TOPICAL_OIL | Status: DC | PRN

## 2023-04-08 MED ORDER — TRANEXAMIC ACID-NACL 1000-0.7 MG/100ML-% IV SOLN
1000.0000 mg | INTRAVENOUS | Status: AC
  Administered 2023-04-08: 1000 mg via INTRAVENOUS

## 2023-04-08 MED ORDER — ACETAMINOPHEN 500 MG PO TABS
1000.0000 mg | ORAL_TABLET | Freq: Four times a day (QID) | ORAL | Status: DC
  Administered 2023-04-08 – 2023-04-11 (×11): 1000 mg via ORAL
  Filled 2023-04-08 (×12): qty 2

## 2023-04-08 MED ORDER — IBUPROFEN 600 MG PO TABS
600.0000 mg | ORAL_TABLET | Freq: Four times a day (QID) | ORAL | Status: AC
  Administered 2023-04-08 – 2023-04-11 (×12): 600 mg via ORAL
  Filled 2023-04-08 (×12): qty 1

## 2023-04-08 MED ORDER — MEASLES, MUMPS & RUBELLA VAC IJ SOLR: 0.5000 mL | Freq: Once | INTRAMUSCULAR | Status: DC

## 2023-04-08 MED ORDER — TRANEXAMIC ACID-NACL 1000-0.7 MG/100ML-% IV SOLN
INTRAVENOUS | Status: AC
  Filled 2023-04-08: qty 100

## 2023-04-08 MED ORDER — SODIUM CHLORIDE 0.9 % IR SOLN
Status: DC | PRN
  Administered 2023-04-08: 1

## 2023-04-08 MED ORDER — NALOXONE HCL 4 MG/10ML IJ SOLN: 1.0000 ug/kg/h | INTRAVENOUS | Status: DC | PRN

## 2023-04-08 MED ORDER — SIMETHICONE 80 MG PO CHEW
80.0000 mg | CHEWABLE_TABLET | Freq: Three times a day (TID) | ORAL | Status: DC
  Administered 2023-04-08 – 2023-04-11 (×10): 80 mg via ORAL
  Filled 2023-04-08 (×10): qty 1

## 2023-04-08 MED ORDER — SOD CITRATE-CITRIC ACID 500-334 MG/5ML PO SOLN
ORAL | Status: AC
  Filled 2023-04-08: qty 30

## 2023-04-08 MED ORDER — ENOXAPARIN SODIUM 40 MG/0.4ML IJ SOSY
40.0000 mg | PREFILLED_SYRINGE | INTRAMUSCULAR | Status: DC
  Administered 2023-04-10 – 2023-04-11 (×2): 40 mg via SUBCUTANEOUS
  Filled 2023-04-08 (×2): qty 0.4

## 2023-04-08 MED ORDER — MORPHINE SULFATE (PF) 0.5 MG/ML IJ SOLN
INTRAMUSCULAR | Status: DC | PRN
  Administered 2023-04-08: 150 ug via INTRATHECAL

## 2023-04-08 MED ORDER — SOD CITRATE-CITRIC ACID 500-334 MG/5ML PO SOLN
30.0000 mL | ORAL | Status: AC
  Administered 2023-04-08: 30 mL via ORAL

## 2023-04-08 MED ORDER — ONDANSETRON HCL 4 MG/2ML IJ SOLN
INTRAMUSCULAR | Status: AC
  Filled 2023-04-08: qty 2

## 2023-04-08 MED ORDER — DEXAMETHASONE SODIUM PHOSPHATE 10 MG/ML IJ SOLN
INTRAMUSCULAR | Status: DC | PRN
Start: 2023-04-08 — End: 2023-04-08
  Administered 2023-04-08: 10 mg via INTRAVENOUS

## 2023-04-08 MED ORDER — DIPHENHYDRAMINE HCL 25 MG PO CAPS: 25.0000 mg | ORAL_CAPSULE | ORAL | Status: DC | PRN

## 2023-04-08 MED ORDER — SENNOSIDES-DOCUSATE SODIUM 8.6-50 MG PO TABS
2.0000 | ORAL_TABLET | ORAL | Status: DC
  Administered 2023-04-08 – 2023-04-11 (×4): 2 via ORAL
  Filled 2023-04-08 (×4): qty 2

## 2023-04-08 MED ORDER — ONDANSETRON HCL 4 MG/2ML IJ SOLN
INTRAMUSCULAR | Status: DC | PRN
  Administered 2023-04-08: 4 mg via INTRAVENOUS

## 2023-04-08 MED ORDER — WITCH HAZEL-GLYCERIN EX PADS: 1.0000 | MEDICATED_PAD | CUTANEOUS | Status: DC | PRN

## 2023-04-08 MED ORDER — FENTANYL CITRATE (PF) 100 MCG/2ML IJ SOLN
INTRAMUSCULAR | Status: DC | PRN
Start: 2023-04-08 — End: 2023-04-08
  Administered 2023-04-08: 35 ug via INTRAVENOUS
  Administered 2023-04-08: 50 ug via INTRAVENOUS

## 2023-04-08 MED ORDER — DIBUCAINE (PERIANAL) 1 % EX OINT: 1.0000 | TOPICAL_OINTMENT | CUTANEOUS | Status: DC | PRN

## 2023-04-08 MED ORDER — BUPIVACAINE IN DEXTROSE 0.75-8.25 % IT SOLN
INTRATHECAL | Status: DC | PRN
Start: 2023-04-08 — End: 2023-04-08
  Administered 2023-04-08: 1.6 mL via INTRATHECAL

## 2023-04-08 MED ORDER — ONDANSETRON HCL 4 MG/2ML IJ SOLN: 4.0000 mg | Freq: Three times a day (TID) | INTRAMUSCULAR | Status: DC | PRN

## 2023-04-08 MED ORDER — DIPHENHYDRAMINE HCL 50 MG/ML IJ SOLN: 12.5000 mg | INTRAMUSCULAR | Status: DC | PRN

## 2023-04-08 MED ORDER — OXYTOCIN-SODIUM CHLORIDE 30-0.9 UT/500ML-% IV SOLN: 2.5000 [IU]/h | INTRAVENOUS | Status: AC

## 2023-04-08 MED ORDER — SCOPOLAMINE 1 MG/3DAYS TD PT72
MEDICATED_PATCH | TRANSDERMAL | Status: AC
  Filled 2023-04-08: qty 1

## 2023-04-08 MED ORDER — PHENYLEPHRINE HCL-NACL 20-0.9 MG/250ML-% IV SOLN
INTRAVENOUS | Status: DC | PRN
  Administered 2023-04-08: 60 ug/min via INTRAVENOUS

## 2023-04-08 MED ORDER — CEFAZOLIN SODIUM-DEXTROSE 2-4 GM/100ML-% IV SOLN
INTRAVENOUS | Status: AC
  Filled 2023-04-08: qty 100

## 2023-04-08 MED ORDER — POVIDONE-IODINE 10 % EX SWAB
2.0000 | Freq: Once | CUTANEOUS | Status: AC
  Administered 2023-04-08: 2 via TOPICAL

## 2023-04-08 MED ORDER — SIMETHICONE 80 MG PO CHEW: 80.0000 mg | CHEWABLE_TABLET | ORAL | Status: DC | PRN

## 2023-04-08 MED ORDER — SCOPOLAMINE 1 MG/3DAYS TD PT72
1.0000 | MEDICATED_PATCH | Freq: Once | TRANSDERMAL | Status: AC
  Administered 2023-04-08: 1.5 mg via TRANSDERMAL
  Filled 2023-04-08: qty 1

## 2023-04-08 MED ORDER — CEFAZOLIN SODIUM-DEXTROSE 2-4 GM/100ML-% IV SOLN
2.0000 g | INTRAVENOUS | Status: AC
  Administered 2023-04-08: 2 g via INTRAVENOUS

## 2023-04-08 SURGICAL SUPPLY — 32 items
APL PRP STRL LF DISP 70% ISPRP (MISCELLANEOUS) ×2
APL SKNCLS STERI-STRIP NONHPOA (GAUZE/BANDAGES/DRESSINGS) ×1
BENZOIN TINCTURE PRP APPL 2/3 (GAUZE/BANDAGES/DRESSINGS) ×1 IMPLANT
CHLORAPREP W/TINT 26 (MISCELLANEOUS) ×2 IMPLANT
CLAMP UMBILICAL CORD (MISCELLANEOUS) ×1 IMPLANT
CLOTH BEACON ORANGE TIMEOUT ST (SAFETY) ×1 IMPLANT
DRSG OPSITE POSTOP 4X10 (GAUZE/BANDAGES/DRESSINGS) ×1 IMPLANT
ELECT REM PT RETURN 9FT ADLT (ELECTROSURGICAL) ×1
ELECTRODE REM PT RTRN 9FT ADLT (ELECTROSURGICAL) ×1 IMPLANT
EXTRACTOR VACUUM M CUP 4 TUBE (SUCTIONS) IMPLANT
GAUZE PAD ABD 7.5X8 STRL (GAUZE/BANDAGES/DRESSINGS) IMPLANT
GAUZE SPONGE 4X4 12PLY STRL LF (GAUZE/BANDAGES/DRESSINGS) IMPLANT
GLOVE BIOGEL PI IND STRL 7.0 (GLOVE) ×2 IMPLANT
GLOVE BIOGEL PI IND STRL 7.5 (GLOVE) ×2 IMPLANT
GLOVE ECLIPSE 7.5 STRL STRAW (GLOVE) ×1 IMPLANT
GOWN STRL REUS W/TWL LRG LVL3 (GOWN DISPOSABLE) ×3 IMPLANT
KIT ABG SYR 3ML LUER SLIP (SYRINGE) IMPLANT
NDL HYPO 25X5/8 SAFETYGLIDE (NEEDLE) IMPLANT
NEEDLE HYPO 25X5/8 SAFETYGLIDE (NEEDLE) IMPLANT
NS IRRIG 1000ML POUR BTL (IV SOLUTION) ×1 IMPLANT
PACK C SECTION WH (CUSTOM PROCEDURE TRAY) ×1 IMPLANT
PAD OB MATERNITY 4.3X12.25 (PERSONAL CARE ITEMS) ×1 IMPLANT
RTRCTR C-SECT PINK 25CM LRG (MISCELLANEOUS) ×1 IMPLANT
STRIP CLOSURE SKIN 1/2X4 (GAUZE/BANDAGES/DRESSINGS) ×1 IMPLANT
SUT VIC AB 0 CT1 36 (SUTURE) ×3 IMPLANT
SUT VIC AB 2-0 CT1 27 (SUTURE) ×1
SUT VIC AB 2-0 CT1 TAPERPNT 27 (SUTURE) ×1 IMPLANT
SUT VIC AB 4-0 KS 27 (SUTURE) ×1 IMPLANT
SUTURE PLAIN GUT 2.0 ETHICON (SUTURE) IMPLANT
TOWEL OR 17X24 6PK STRL BLUE (TOWEL DISPOSABLE) ×1 IMPLANT
TRAY FOLEY W/BAG SLVR 14FR LF (SET/KITS/TRAYS/PACK) ×1 IMPLANT
WATER STERILE IRR 1000ML POUR (IV SOLUTION) ×1 IMPLANT

## 2023-04-08 NOTE — Anesthesia Procedure Notes (Signed)
Spinal  Patient location during procedure: OR Start time: 04/08/2023 9:42 AM End time: 04/08/2023 9:44 AM Staffing Performed: anesthesiologist  Anesthesiologist: Atilano Median, DO Performed by: Atilano Median, DO Authorized by: Atilano Median, DO   Preanesthetic Checklist Completed: patient identified, IV checked, site marked, risks and benefits discussed, surgical consent, monitors and equipment checked, pre-op evaluation and timeout performed Spinal Block Patient position: sitting Prep: DuraPrep Patient monitoring: heart rate, cardiac monitor, continuous pulse ox and blood pressure Approach: midline Location: L3-4 Injection technique: single-shot Needle Needle type: Pencan  Needle gauge: 24 G Needle length: 10 cm Assessment Events: CSF return Additional Notes Patient identified. Risks/Benefits/Options discussed with patient including but not limited to bleeding, infection, nerve damage, paralysis, failed block, incomplete pain control, headache, blood pressure changes, nausea, vomiting, reactions to medications, itching and postpartum back pain. Confirmed with bedside nurse the patient's most recent platelet count. Confirmed with patient that they are not currently taking any anticoagulation, have any bleeding history or any family history of bleeding disorders. Patient expressed understanding and wished to proceed. All questions were answered. Sterile technique was used throughout the entire procedure. Please see nursing notes for vital signs. Warning signs of high block given to the patient including shortness of breath, tingling/numbness in hands, complete motor block, or any concerning symptoms with instructions to call for help. Patient was given instructions on fall risk and not to get out of bed. All questions and concerns addressed with instructions to call with any issues or inadequate analgesia.

## 2023-04-08 NOTE — Lactation Note (Signed)
This note was copied from a baby's chart. Lactation Consultation Note  Patient Name: Wendy Morrison [id number] Date: 04/08/2023 Age:27 hours Reason for consult: Initial assessment;Term;Maternal endocrine disorder Mom sleepy. Mom stated she has given formula because she doesn't have any milk. Mom didn't need a translator for consult. Hand expression taught demonstrating colostrum. Mom happy to see colostrum. Encouraged to put baby to the breast and assess for milk transfer after feeding. Mom encouraged to feed baby 8-12 times/24 hours and with feeding cues.  Encouraged mom to call for latch assistance tonight.  Maternal Data Has patient been taught Hand Expression?: Yes Does the patient have breastfeeding experience prior to this delivery?: Yes How long did the patient breastfeed?: 6 months  Feeding    LATCH Score       Type of Nipple: Everted at rest and after stimulation  Comfort (Breast/Nipple): Soft / non-tender         Lactation Tools Discussed/Used    Interventions Interventions: Breast massage;Hand express;LC Services brochure  Discharge    Consult Status Consult Status: Follow-up Date: 04/09/23 Follow-up type: In-patient    Winford Hehn, Diamond Nickel 04/08/2023, 8:30 PM

## 2023-04-08 NOTE — Anesthesia Postprocedure Evaluation (Signed)
Anesthesia Post Note  Patient: Wendy Morrison  Procedure(s) Performed: CESAREAN SECTION     Patient location during evaluation: Mother Baby Anesthesia Type: Spinal Level of consciousness: oriented and awake and alert Pain management: pain level controlled Vital Signs Assessment: post-procedure vital signs reviewed and stable Respiratory status: spontaneous breathing and respiratory function stable Cardiovascular status: blood pressure returned to baseline and stable Postop Assessment: no headache, no backache, no apparent nausea or vomiting and able to ambulate Anesthetic complications: no   No notable events documented.  Last Vitals:  Vitals:   04/08/23 1230 04/08/23 1249  BP: 113/62 113/68  Pulse: 72 69  Resp: (!) 22 20  Temp:  37.1 C  SpO2: 98% 100%    Last Pain:  Vitals:   04/08/23 1249  TempSrc: Oral  PainSc:                  Wendy Morrison

## 2023-04-08 NOTE — Discharge Summary (Addendum)
Postpartum Discharge Summary  Date of Service updated-10/20     Patient Name: Wendy Morrison DOB: 03-20-1996 MRN: [id number]  Date of admission: 04/08/2023 Delivery date:04/08/2023 Delivering provider: Shonna Chock [place] Date of discharge: 04/11/2023  Admitting diagnosis: Pregnancy [Z34.90] Intrauterine pregnancy: [redacted]w[redacted]d     Secondary diagnosis:  Principal Problem:   Pregnancy Active Problems:   Hypothyroidism   Rubella non-immune status, antepartum   Breech presentation  Additional problems: none    Discharge diagnosis: Term Pregnancy Delivered , repeat LTCS given hx of prior and breech presentation and maternal request                                              Post partum procedures: none Augmentation: N/A Complications: None  Hospital course: Sceduled C/S   27 y.o. yo [id number] at [redacted]w[redacted]d was admitted to the hospital 04/08/2023 for scheduled cesarean section with the following indication:Elective Repeat, Malpresentation, and hx of prior .Delivery details are as follows:  Membrane Rupture Time/Date: 10:07 AM,04/08/2023  Delivery Method:C-Section, Low Transverse Operative Delivery:N/A Details of operation can be found in separate operative note.  Patient had a postpartum course that was uncomplicated.  She is ambulating, tolerating a regular diet, passing flatus, and urinating well. Patient is discharged home in stable condition on  04/11/23        Newborn Data: Birth date:04/08/2023 Birth time:10:10 AM Gender:Female Living status:Living Apgars:8 ,9  Weight:3620 g    Magnesium Sulfate received: No BMZ received: No Rhophylac:N/A ONG:EXBMWUX  T-DaP:Given prenatally Flu: No RSV Vaccine received: No Transfusion:No  Immunizations received: Immunization History  Administered Date(s) Administered   Tdap 03/17/2016    Physical exam  Vitals:   04/10/23 0520 04/10/23 1425 04/11/23 0055 04/11/23 0530  BP: 111/66 101/62 114/75 118/70  Pulse: 69 83 82 78   Resp: 16 16 18 17   Temp: 97.8 F (36.6 C) 98.5 F (36.9 C) 98 F (36.7 C) 98.3 F (36.8 C)  TempSrc:  Oral Oral Oral  SpO2:  99% 99% 96%  Weight:      Height:       General: alert, cooperative, and no distress Lochia: appropriate Uterine Fundus: firm Incision: Dressing is clean, dry, and intact DVT Evaluation: No evidence of DVT seen on physical exam. Labs: Lab Results  Component Value Date   WBC 10.3 04/09/2023   HGB 8.6 (L) 04/09/2023   HCT 26.0 (L) 04/09/2023   MCV 82.8 04/09/2023   PLT 230 04/09/2023      Latest Ref Rng & Units 12/10/2022    4:35 PM  CMP  Glucose 70 - 99 mg/dL 78   BUN 6 - 20 mg/dL 5   Creatinine 3.24 - 4.01 mg/dL 0.27   Sodium 253 - 664 mmol/L 138   Potassium 3.5 - 5.2 mmol/L 3.8   Chloride 96 - 106 mmol/L 105   CO2 20 - 29 mmol/L 19   Calcium 8.7 - 10.2 mg/dL 8.9   Total Protein 6.0 - 8.5 g/dL 6.0   Total Bilirubin 0.0 - 1.2 mg/dL 0.3   Alkaline Phos 44 - 121 IU/L 60   AST 0 - 40 IU/L 14   ALT 0 - 32 IU/L 7    Edinburgh Score:    04/11/2023   12:00 PM  Edinburgh Postnatal Depression Scale Screening Tool  I have been able to laugh and see the funny  side of things. 0  I have looked forward with enjoyment to things. 0  I have blamed myself unnecessarily when things went wrong. 1  I have been anxious or worried for no good reason. 2  I have felt scared or panicky for no good reason. 0  Things have been getting on top of me. 1  I have been so unhappy that I have had difficulty sleeping. 1  I have felt sad or miserable. 1  I have been so unhappy that I have been crying. 0  The thought of harming myself has occurred to me. 0  Edinburgh Postnatal Depression Scale Total 6   Edinburgh Postnatal Depression Scale Total: 6   After visit meds:  Allergies as of 04/11/2023       Reactions   Pecan Extract Diarrhea, Swelling   Penicillins Itching, Rash           Medication List     STOP taking these medications    aspirin EC 81 MG  tablet   PRE-NATAL PO       TAKE these medications    acetaminophen 500 MG tablet Commonly known as: TYLENOL Take 1 tablet (500 mg total) by mouth every 6 (six) hours as needed.   docusate sodium 100 MG capsule Commonly known as: COLACE Take 1 capsule (100 mg total) by mouth daily for 20 days. What changed:  when to take this reasons to take this   ferrous sulfate 325 (65 FE) MG tablet Take 1 tablet (325 mg total) by mouth every other day. Start taking on: April 12, 2023 What changed: when to take this   gabapentin 100 MG capsule Commonly known as: NEURONTIN Take 2 capsules (200 mg total) by mouth every 6 (six) hours as needed (Incisional pain).   ibuprofen 600 MG tablet Commonly known as: ADVIL Take 1 tablet (600 mg total) by mouth every 6 (six) hours.   norethindrone 0.35 MG tablet Commonly known as: Ortho Micronor Take 1 tablet (0.35 mg total) by mouth daily.   oxyCODONE 5 MG immediate release tablet Commonly known as: Oxy IR/ROXICODONE Take 1 tablet (5 mg total) by mouth every 6 (six) hours as needed for up to 7 days for moderate pain (pain score 4-6).         Discharge home in stable condition Infant Feeding: Breast Infant Disposition:home with mother Discharge instruction: per After Visit Summary and Postpartum booklet. Activity: Advance as tolerated. Pelvic rest for 6 weeks.  Diet: routine diet Future Appointments:No future appointments. Follow up Visit:  Follow-up Information     [facility] Follow up in 1 week(s).   Why: Please follow up in one week for an incision check Contact information: [address] [place] [address] [phone]               Message sent to [facility] 10/17  Please schedule this patient for a In person postpartum visit in 4 weeks with the following provider: Any provider. Additional Postpartum F/U:Incision check 1 week  Low risk pregnancy complicated by:  breech  presentation, hypothyroidism, RNI Delivery mode:  C-Section, Low Transverse Anticipated Birth Control:  POPs   Addendum:  Patient Discharge had previously been discharge by Dr. Charlotta Newton but later requested that a short supply of Gabapentin be sent into her pharmacy prior to leaving.  Sent 5 day supply.  No other concerns.  04/11/2023 Hessie Dibble, MD

## 2023-04-08 NOTE — Transfer of Care (Signed)
Immediate Anesthesia Transfer of Care Note  Patient: Wendy Morrison  Procedure(s) Performed: CESAREAN SECTION  Patient Location: PACU  Anesthesia Type:Spinal  Level of Consciousness: awake, alert , and oriented  Airway & Oxygen Therapy: Patient Spontanous Breathing  Post-op Assessment: Report given to RN and Post -op Vital signs reviewed and stable  Post vital signs: Reviewed and stable  Last Vitals:  Vitals Value Taken Time  BP 101/68 04/08/23 1119  Temp    Pulse 78 04/08/23 1122  Resp 21 04/08/23 1122  SpO2 99 % 04/08/23 1122  Vitals shown include unfiled device data.  Last Pain:  Vitals:   04/08/23 0812  TempSrc: Oral  PainSc: 0-No pain      Patients Stated Pain Goal: 4 (04/08/23 1027)  Complications: No notable events documented.

## 2023-04-08 NOTE — Op Note (Signed)
Cesarean Section Operative Report  Wendy Morrison  04/08/2023  Indications: history prior cesarean section, breech presentation, declined ECV, maternal request  Pre-operative Diagnosis: repeat cesarean section.   Post-operative Diagnosis: Same   Surgeon: Surgeons and Role:    * Shakeem Stern, Wilfred Curtis, MD - Primary    * Judd Lien, Wenda Low, MD - Assisting   Attending Attestation: I was present and scrubbed for the entire procedure.   An experienced assistant was required given the standard of surgical care given the complexity of the case.  This assistant was needed for exposure, dissection, suctioning, retraction, instrument exchange, assisting with delivery with administration of fundal pressure, and for overall help during the procedure.  Anesthesia: spinal    Quantified Blood Loss: 709 ml  Total IV Fluids: 2300 ml LR  Urine Output:: 400 ml clear yellow urine  Specimens: none  Findings: Viable female infant in transverse back up presentation; Apgars pending; weight pending; arterial cord pH not obtained;  clear amniotic fluid; intact placenta with three vessel cord; normal uterus, fallopian tubes and ovaries bilaterally.  Baby condition / location:  Couplet care / Skin to Skin   Complications: no complications. No significant adhesive disease.  Indications: Wendy Morrison is a 27 y.o. [phone] with an IUP [redacted]w[redacted]d presenting for scheduled elective repeat cesarean.  The risks, benefits, complications, treatment options, and exected outcomes were discussed with the patient . The patient dwith the proposed plan, giving informed consent. identified as Wendy Morrison and the procedure verified as C-Section Delivery.  Procedure Details:  The patient was taken back to the operative suite where spinal anesthesia was placed.  A time out was held and the above information confirmed.   After induction of anesthesia, the patient was draped and prepped in the usual sterile manner  and placed in a dorsal supine position with a leftward tilt. A Pfannenstiel incision was made and carried down through the subcutaneous tissue to the fascia. Fascial incision was made and sharply extended transversely. The fascia was separated from the underlying rectus tissue superiorly and inferiorly. The peritoneum was identified and sharply entered and extended longitudinally. Alexis retractor was placed. A bladder flap was created. A low transverse uterine incision was made and extended bluntly. Delivered from the transverse presentation with some difficulty (given back-up presentation) was a viable infant with Apgars and weight as above.  Breech extraction legs first. After waiting 60 seconds for delayed cord cutting, the umbilical cord was clamped and cut cord blood was obtained for evaluation. Cord ph was not sent. The placenta was removed Intact and appeared normal. The uterine outline, tubes and ovaries appeared normal. The uterine incision was closed with running unlocked sutures 0-Vicryl in one layer.   Hemostasis was observed.  The rectus muscles were examined and hemostasis observed. The fascia was then reapproximated with running sutures of 0-Vicryl. The subcuticular closure was performed with 2-0 plain gut. The skin was closed with 4-0 Vicryl.  Instrument, sponge, and needle counts were correct prior the abdominal closure and were correct at the conclusion of the case.     Disposition: PACU - hemodynamically stable.   Maternal Condition: stable       Signed: Silvano Bilis, MD 04/08/2023 12:35 PM

## 2023-04-08 NOTE — H&P (Signed)
LABOR AND DELIVERY ADMISSION HISTORY AND PHYSICAL NOTE  Wendy Morrison is a 27 y.o. female G3P1011 with IUP at [redacted]w[redacted]d by L/20 presenting for scheduled elective repeat cesarean.   She reports positive fetal movement. She denies leakage of fluid or vaginal bleeding.  Prenatal History/Complications:  Past Medical History: Past Medical History:  Diagnosis Date   Dizziness 12/10/2015   Family history of adverse reaction to anesthesia    mom slow to wake   Hypothyroidism    Indication for care in labor or delivery 05/28/2016   Lower back pain 02/20/2016   Nausea with vomiting 12/03/2015    Past Surgical History: Past Surgical History:  Procedure Laterality Date   CESAREAN SECTION N/A 05/28/2016   Procedure: CESAREAN SECTION;  Surgeon: [place] Bing, MD;  Location: [facility] BIRTHING SUITES;  Service: Obstetrics;  Laterality: N/A;   NO PAST SURGERIES      Obstetrical History: OB History     Gravida  3   Para  1   Term  1   Preterm      AB  1   Living  1      SAB  0   IAB      Ectopic      Multiple  0   Live Births  1           Social History: Social History   Socioeconomic History   Marital status: Single    Spouse name: Not on file   Number of children: Not on file   Years of education: Not on file   Highest education level: Not on file  Occupational History   Not on file  Tobacco Use   Smoking status: Never   Smokeless tobacco: Never  Vaping Use   Vaping status: Never Used  Substance and Sexual Activity   Alcohol use: No   Drug use: No   Sexual activity: Yes    Partners: Male    Birth control/protection: None    Comment: 1st intercourse- 17, partners- 2   Other Topics Concern   Not on file  Social History Narrative   Not on file   Social Determinants of Health   Financial Resource Strain: Not on file  Food Insecurity: No Food Insecurity (04/08/2023)   Hunger Vital Sign    Worried About Running Out of Food in the Last Year: Never  true    Ran Out of Food in the Last Year: Never true  Transportation Needs: No Transportation Needs (04/08/2023)   PRAPARE - Administrator, Civil Service (Medical): No    Lack of Transportation (Non-Medical): No  Physical Activity: Not on file  Stress: Not on file  Social Connections: Not on file    Family History: Family History  Problem Relation Age of Onset   Cancer Maternal Grandmother        leukemia   Heart disease Paternal Grandmother    Diabetes Mother    Hypertension Mother    Diabetes Father    Cancer Maternal Grandfather        pancreatic    Allergies: Allergies  Allergen Reactions   Pecan Extract Diarrhea and Swelling   Penicillins Itching and Rash         Medications Prior to Admission  Medication Sig Dispense Refill Last Dose   aspirin EC 81 MG tablet Take 1 tablet (81 mg total) by mouth daily. Swallow whole. 30 tablet 5    docusate sodium (COLACE) 100 MG capsule Take 100 mg  by mouth 2 (two) times daily as needed for mild constipation.      ferrous sulfate 325 (65 FE) MG tablet Take 325 mg by mouth once a week.      Prenatal Multivit-Min-Fe-FA (PRE-NATAL PO) Take 1 tablet by mouth daily.        Review of Systems   All systems reviewed and negative except as stated in HPI  Blood pressure 113/79, pulse 100, temperature 98.4 F (36.9 C), temperature source Oral, resp. rate 18, height 5\' 4"  (1.626 m), weight 91.1 kg, last menstrual period 07/05/2022, SpO2 98%, currently breastfeeding. General appearance:  Lungs: clear to auscultation bilaterally Heart: regular rate and rhythm Abdomen: soft, non-tender; bowel sounds normal Extremities: No calf swelling or tenderness Presentation: breech Fetal monitoring: 150s Uterine activity: quiet     Prenatal labs: ABO, Rh: --/--/B POS (10/15 1034) Antibody: NEG (10/15 1034) Rubella: <0.90 (03/19 1650) RPR: NON REACTIVE (10/15 1033)  HBsAg: Negative (03/19 1650)  HIV: Non Reactive (08/01 1025)   GBS: Negative/-- (09/24 1217)  1 hr Glucola: 115 Genetic screening:  not performed Anatomy US: wnl  Prenatal Transfer Tool  Maternal Diabetes: No Genetic Screening: Declined Maternal Ultrasounds/Referrals: Normal Fetal Ultrasounds or other Referrals:  None Maternal Substance Abuse:  No Significant Maternal Medications:  None Significant Maternal Lab Results: Group B Strep negative  No results found for this or any previous visit (from the past 24 hour(s)).  Patient Active Problem List   Diagnosis Date Noted   Pregnancy 04/08/2023   S/P emergency C-section 07/15/2016   Breech presentation 05/13/2016   Rubella non-immune status, antepartum 04/02/2016   Supervision of normal pregnancy in third trimester 01/15/2016   Hypothyroidism 12/10/2015    Assessment: Wendy Morrison is a 27 y.o. G3P1011 at [redacted]w[redacted]d here for scheduled elective repeat cesarean section. We discussed risks and benefits of repeat cesarean, attempt at breech delivery (which I did not advise), and attempt at ECV with attempt at vaginal delivery (I explained the patient is an acceptable candidate for this). She elects to proceed with elective repeat cesarean section.  The risks of cesarean section were discussed with the patient including but were not limited to: bleeding which may require transfusion or reoperation; infection which may require antibiotics; injury to bowel, bladder, ureters or other surrounding organs; injury to the fetus; need for additional procedures including hysterectomy in the event of a life-threatening hemorrhage; placental abnormalities wth subsequent pregnancies, incisional problems, thromboembolic phenomenon and other postoperative/anesthesia complications. She declines tubal sterilization. The patient concurred with the proposed plan, giving informed written consent for the procedures.  Patient has been NPO since midnight she will remain NPO for procedure. Anesthesia and OR aware.  Preoperative  prophylactic antibiotics and SCDs ordered on call to the OR.  To OR when ready.  # Pain: spinal  # Antibiotics: ancef ordered, has listed pcn allergy but has tolerated cephalosporin in the past  # contraception: desires some sort of pill, will need to discuss options further  # circ: n/a  # Feeding: breast  # Breech presentation: female sex, infant will need hip u/s in infancy  # rubella NI: will plan on MMR postpartum  # hypothyroid?: says euthyroid prior to pregnancy and during pregnancy and hasn't been on meds for some time  Silvano Bilis 04/08/2023, 9:04 AM

## 2023-04-09 LAB — CBC
HCT: 26 % — ABNORMAL LOW (ref 36.0–46.0)
Hemoglobin: 8.6 g/dL — ABNORMAL LOW (ref 12.0–15.0)
MCH: 27.4 pg (ref 26.0–34.0)
MCHC: 33.1 g/dL (ref 30.0–36.0)
MCV: 82.8 fL (ref 80.0–100.0)
Platelets: 230 10*3/uL (ref 150–400)
RBC: 3.14 MIL/uL — ABNORMAL LOW (ref 3.87–5.11)
RDW: 13.4 % (ref 11.5–15.5)
WBC: 10.3 10*3/uL (ref 4.0–10.5)
nRBC: 0 % (ref 0.0–0.2)

## 2023-04-09 MED ORDER — SODIUM CHLORIDE 0.9% FLUSH
10.0000 mL | Freq: Two times a day (BID) | INTRAVENOUS | Status: DC
  Administered 2023-04-09: 10 mL via INTRAVENOUS

## 2023-04-09 NOTE — Progress Notes (Signed)
Subjective: Postpartum Day 1: Cesarean Delivery Patient reports incisional pain, tolerating PO, and + flatus.    Objective: Vital signs in last 24 hours: Temp:  [97.9 F (36.6 C)-98.7 F (37.1 C)] 98.4 F (36.9 C) (10/18 0140) Pulse Rate:  [60-100] 60 (10/18 0140) Resp:  [11-22] 18 (10/18 0140) BP: (96-115)/(53-89) 96/53 (10/18 0140) SpO2:  [97 %-100 %] 97 % (10/17 1445) Weight:  [91.1 kg] 91.1 kg (10/17 8413)  Physical Exam:  General: alert, cooperative, and no distress Lochia: appropriate Uterine Fundus: firm Incision: no significant drainage DVT Evaluation: No evidence of DVT seen on physical exam.  Recent Labs    04/06/23 1033 04/09/23 0449  HGB 11.1* 8.6*  HCT 34.0* 26.0*    Assessment/Plan: Status post Cesarean section. Doing well postoperatively.  Continue current care.  Sharen Counter, CNM 04/09/2023, 6:51 AM

## 2023-04-09 NOTE — Progress Notes (Signed)
MOB was referred for history of anxiety. ?* Referral screened out by Clinical Social Worker because none of the following criteria appear to apply: ?~ History of anxiety/depression during this pregnancy, or of post-partum depression following prior delivery. ?~ Diagnosis of anxiety and/or depression within last 3 years ?OR ?* MOB's symptoms currently being treated with medication and/or therapy. Per chart review, MOB is currently participating in therapy.  ? ?Please contact the Clinical Social Worker if needs arise, by [facility] request, or if MOB scores greater than 9/yes to question 10 on Edinburgh Postpartum Depression Screen. ? ?Abundio Miu, LCSW ?Clinical Social Worker ?[facility] ?Cell#: [phone] ? ?

## 2023-04-10 MED ORDER — FERROUS SULFATE 325 (65 FE) MG PO TABS
325.0000 mg | ORAL_TABLET | ORAL | Status: DC
  Administered 2023-04-10: 325 mg via ORAL
  Filled 2023-04-10: qty 1

## 2023-04-10 MED ORDER — SODIUM CHLORIDE 0.9 % IV SOLN
500.0000 mg | Freq: Once | INTRAVENOUS | Status: DC
  Filled 2023-04-10: qty 25

## 2023-04-10 MED ORDER — POLYETHYLENE GLYCOL 3350 17 G PO PACK
17.0000 g | PACK | Freq: Every day | ORAL | Status: DC
  Administered 2023-04-10 – 2023-04-11 (×2): 17 g via ORAL
  Filled 2023-04-10 (×2): qty 1

## 2023-04-10 MED ORDER — GABAPENTIN 100 MG PO CAPS
200.0000 mg | ORAL_CAPSULE | Freq: Four times a day (QID) | ORAL | Status: DC | PRN
  Administered 2023-04-10 – 2023-04-11 (×3): 200 mg via ORAL
  Filled 2023-04-10 (×4): qty 2

## 2023-04-10 NOTE — Progress Notes (Signed)
When OB rapid RN came to start a new IV on the patient for her to receive her iron infusion patient declined the start of a new IV.  Dr. Judd Lien was notified.  Shivaan Tierno, [place]

## 2023-04-10 NOTE — Lactation Note (Signed)
This note was copied from a baby's chart. Lactation Consultation Note  Patient Name: Wendy Morrison [id number] Date: 04/10/2023 Age:27 hours Reason for consult: Follow-up assessment;Term;Maternal endocrine disorder  P2- MOB states that feedings are going well. MOB has been pumping and offering both EBM and formula via bottle. MOB plans to latch infant to the breast, but states that she does not want to until her milk comes in. MOB is now starting to see volume when she pumps.   MOB denies having any questions or concerns at this moment. LC reviewed CDC milk storage guidelines, LC services and engorgement/breast care. LC encouraged MOB to call for further assistance as needed.  Maternal Data Has patient been taught Hand Expression?: Yes Does the patient have breastfeeding experience prior to this delivery?: Yes How long did the patient breastfeed?: 6 months of exclusive pumping  Feeding Mother's Current Feeding Choice: Breast Milk and Formula  Lactation Tools Discussed/Used Tools: Pump;Flanges Breast pump type: Double-Electric Breast Pump;Manual Pump Education: Setup, frequency, and cleaning;Milk Storage  Interventions Interventions: Breast feeding basics reviewed;Education;DEBP;LC Services brochure  Discharge Discharge Education: Engorgement and breast care;Warning signs for feeding baby  Consult Status Consult Status: PRN Date: 04/11/23 Follow-up type: In-patient    Dema Severin BS, IBCLC 04/10/2023, 2:51 PM

## 2023-04-10 NOTE — Progress Notes (Signed)
OB/GYN Faculty Attending Note  Post Op Day 2  Subjective: Patient is feeling significant pain with movement. She reports moderately well controlled pain on PO pain meds. She is ambulating and reports mild light-headedness/dizziness when she has been standing for a long period. She is voiding spontaneously. She is passing flatus, has not had a BM yet. She is tolerating a regular diet without nausea/vomiting. Bleeding is moderate. She is breast feeding. Baby is in room and doing well.  Objective: Blood pressure 111/66, pulse 69, temperature 97.8 F (36.6 C), resp. rate 16, height 5\' 4"  (1.626 m), weight 91.1 kg, last menstrual period 07/05/2022, SpO2 93%, unknown if currently breastfeeding. Temp:  [97.8 F (36.6 C)-98.4 F (36.9 C)] 97.8 F (36.6 C) (10/19 0520) Pulse Rate:  [69-77] 69 (10/19 0520) Resp:  [16-17] 16 (10/19 0520) BP: (105-111)/(66-68) 111/66 (10/19 0520) SpO2:  [93 %] 93 % (10/18 2042)  Physical Exam:  General: alert, oriented, cooperative, appears fatigued Chest: normal respiratory effort Heart: regular rate  Abdomen: soft, distended, appropriately tender to palpation, incision covered by dressing with no evidence of active bleeding  Uterine Fundus: firm, at the level of the umbilicus Lochia: moderate, rubra DVT Evaluation: no evidence of DVT Extremities: no edema, no calf tenderness  UOP: voiding spontaneously  Recent Labs    04/09/23 0449  HGB 8.6*  HCT 26.0*    Assessment/Plan: Patient Active Problem List   Diagnosis Date Noted   Pregnancy 04/08/2023   S/P emergency C-section 07/15/2016   Breech presentation 05/13/2016   Rubella non-immune status, antepartum 04/02/2016   Supervision of normal pregnancy in third trimester 01/15/2016   Hypothyroidism 12/10/2015    Patient is 27 y.o. [id number] POD#2 s/p RCS at [redacted]w[redacted]d for h/o c-section. Course complicated by acute blood loss anemia. IV iron recommended but IV access lost and she originally agreed to new IV,  however declined when team attempted. Will start PO. She is doing okay, recovering appropriately and complains only of pain but manageable with pain meds.   Continue routine post partum care Pain meds prn Regular diet Lovenox 40 mg daily Kersey undecided for birth control Plan for discharge possibly tomorrow    K. Therese Sarah, MD, [facility] Attending Center for [employer] (Faculty Practice)  04/10/2023, 12:28 PM

## 2023-04-10 NOTE — Progress Notes (Addendum)
POSTPARTUM PROGRESS NOTE  POD #2  Subjective:  Wendy Morrison is a 27 y.o. [id number] s/p rLTCS at [redacted]w[redacted]d. No acute events overnight. She reports she is doing well. She does report feeling mildly dizzy upon ambulation for long period of time. She denies any problems with voiding or po intake. Denies nausea or vomiting. She has passed flatus. Pain is moderately controlled.  Lochia is minimal.  Objective: Blood pressure 111/66, pulse 69, temperature 97.8 F (36.6 C), resp. rate 16, height 5\' 4"  (1.626 m), weight 91.1 kg, last menstrual period 07/05/2022, SpO2 93%, unknown if currently breastfeeding.  Physical Exam:  General: alert, cooperative and no distress Chest: no respiratory distress Heart: regular rate, distal pulses intact Uterine Fundus: firm, appropriately tender DVT Evaluation: No calf swelling or tenderness Extremities: No edema Skin: warm, dry; incision clean/dry/intact w/ dressing in place  Recent Labs    04/09/23 0449  HGB 8.6*  HCT 26.0*    Assessment/Plan: Wendy Morrison is a 27 y.o. [id number] s/p elective rLTCS at [redacted]w[redacted]d   POD#2 - Doing welll; pain is well controlled. H/H appropriate  Routine postpartum care  OOB, ambulated  Lovenox for VTE prophylaxis Acute blood loss anemia:   PO iron  Continue daily PNV  Contraception: Progestin only - breastfeeding  Feeding: Breastfeeding  Dispo: Plan for discharge on postoperative day 3.   LOS: 2 days     Charma Igo, MD 04/10/2023, 8:40 AM   Attestation of Attending Supervision of Resident Physician (MD/DO): Evaluation, management, and procedures were performed by the Resident Physician under my supervision and collaboration.  I have reviewed the Resident Physician's note and chart, and I agree with the management and plan.  See my note for additional details  K. Therese Sarah, MD, [facility] Attending Center for [facility] (Faculty Practice)  04/10/2023 12:30 PM

## 2023-04-11 MED ORDER — DOCUSATE SODIUM 100 MG PO CAPS: 100.0000 mg | ORAL_CAPSULE | Freq: Every day | ORAL | 0 refills | Status: AC

## 2023-04-11 MED ORDER — POLYETHYLENE GLYCOL 3350 17 G PO PACK: 17.0000 g | PACK | Freq: Every day | ORAL | Status: DC

## 2023-04-11 MED ORDER — OXYCODONE HCL 5 MG PO TABS: 5.0000 mg | ORAL_TABLET | Freq: Four times a day (QID) | ORAL | 0 refills | Status: AC | PRN

## 2023-04-11 MED ORDER — NORETHINDRONE 0.35 MG PO TABS: 1.0000 | ORAL_TABLET | Freq: Every day | ORAL | 4 refills | Status: AC

## 2023-04-11 MED ORDER — DOCUSATE SODIUM 100 MG PO CAPS
100.0000 mg | ORAL_CAPSULE | Freq: Every day | ORAL | Status: DC
  Administered 2023-04-11: 100 mg via ORAL
  Filled 2023-04-11: qty 1

## 2023-04-11 MED ORDER — ACETAMINOPHEN 500 MG PO TABS: 500.0000 mg | ORAL_TABLET | Freq: Four times a day (QID) | ORAL | 0 refills | Status: AC | PRN

## 2023-04-11 MED ORDER — IBUPROFEN 600 MG PO TABS: 600.0000 mg | ORAL_TABLET | Freq: Four times a day (QID) | ORAL | 0 refills | Status: AC

## 2023-04-11 MED ORDER — GABAPENTIN 100 MG PO CAPS: 200.0000 mg | ORAL_CAPSULE | Freq: Four times a day (QID) | ORAL | 0 refills | Status: AC | PRN

## 2023-04-11 MED ORDER — FERROUS SULFATE 325 (65 FE) MG PO TABS: 325.0000 mg | ORAL_TABLET | ORAL | 0 refills | Status: AC

## 2023-04-11 NOTE — Progress Notes (Signed)
Patient ID: Wendy Morrison, female   DOB: 08/03/1995, 27 y.o.   MRN: [id number] Post Partum Day 3 Subjective: Wendy Morrison is a 27 y.o. [id number] s/p rLTCS at [redacted]w[redacted]d. She is worried about not having a bowel movement. Reports she has baseline constipation and was not able to have bowel movements for 2 weeks after delivery with her first child.She started Miralax and senna yesterday which has not helped her. She has passed flatus. She has moderate pain on left incision when not taking meds. She denies problem with po intake. Denies n/v.   Objective: Blood pressure 118/70, pulse 78, temperature 98.3 F (36.8 C), temperature source Oral, resp. rate 17, height 5\' 4"  (1.626 m), weight 91.1 kg, last menstrual period 07/05/2022, SpO2 96%, unknown if currently breastfeeding.  Physical Exam:  General: alert and cooperative Lochia: appropriate Uterine Fundus: firm Incision: healing well, no significant drainage, no significant erythema DVT Evaluation: No evidence of DVT seen on physical exam.  Recent Labs    04/09/23 0449  HGB 8.6*  HCT 26.0*    Assessment/Plan: Discharge home  Wendy Morrison is a 27 y.o. [id number] s/p elective rLTCS at [redacted]w[redacted]d    POD#3 - Doing welll; pain is well controlled. H/H appropriate             Routine postpartum care             OOB, ambulated             Lovenox for VTE prophylaxis  Add Colace to help with bowel movements Acute blood loss anemia:              PO iron             Continue daily PNV  Contraception: Progestin only - breastfeeding  Feeding: Breastfeeding    Dispo: Plan for discharge   LOS: 3 days   [facility]  BHATTARAI CHHETRI, Medical Student 04/11/2023, 6:30 AM

## 2023-04-11 NOTE — Lactation Note (Signed)
This note was copied from a baby's chart. Lactation Consultation Note  Patient Name: Wendy Morrison [id number] Date: 04/11/2023 Age:27 hours Reason for consult: Follow-up assessment;Primapara;1st time breastfeeding;Term;Maternal endocrine disorder;RN request;Breastfeeding assistance;Engorgement  P2- RN requested LC to come into room to assist with MOB's engorgement. LC noted that MOB had severe bilateral engorgement and she was in a lot of pain. Infant was currently nursing, so LC encouraged MOB to allow infant to nurse first and then call [facility] for assistance once she is done. LC used a warm, wet burp cloth on her breasts to help soften the breasts and allow the milk to flow.  LC encouraged MOB to call when infant was done feeding.  Maternal Data Has patient been taught Hand Expression?: Yes Does the patient have breastfeeding experience prior to this delivery?: Yes How long did the patient breastfeed?: 6 months  Feeding Mother's Current Feeding Choice: Breast Milk and Formula  LATCH Score Latch: Grasps breast easily, tongue down, lips flanged, rhythmical sucking.  Audible Swallowing: Spontaneous and intermittent  Type of Nipple: Everted at rest and after stimulation  Comfort (Breast/Nipple): Filling, red/small blisters or bruises, mild/mod discomfort  Hold (Positioning): Assistance needed to correctly position infant at breast and maintain latch.  LATCH Score: 8   Lactation Tools Discussed/Used Tools: Pump;Flanges;8 oz. bottles Flange Size: 21;18 Breast pump type: Double-Electric Breast Pump;Manual Pump Education: Setup, frequency, and cleaning;Milk Storage Reason for Pumping: engorgement Pumping frequency: 15-20 minutes every 2-3 hrs  Interventions Interventions: Breast feeding basics reviewed;Education;LC Services brochure  Discharge Discharge Education: Engorgement and breast care;Warning signs for feeding baby  Consult Status Consult Status: Follow-up Date:  04/11/23 Follow-up type: In-patient    Wendy Morrison BS, IBCLC 04/11/2023, 2:07 PM

## 2023-04-11 NOTE — Lactation Note (Signed)
This note was copied from a baby's chart. Lactation Consultation Note  Patient Name: Wendy Morrison [id number] Date: 04/11/2023 Age:28 hours Reason for consult: Follow-up assessment;Mother's request;Term;Engorgement;Breastfeeding assistance  P2- MOB called LC to assist with pumping. LC placed heat packs on MOB's breasts as she pumped for 15 minutes. At first MOB was using size 21 mm flanges, but the right breast had too much areola going in and no milk was coming out, so LC switched the right flange to an 18 mm. While MOB was holding the pump, LC gently massaged MOB's breasts (with her permission). After the 15 minutes were done, LC noted a difference in MOB's breasts, but she asked to pump a little longer because they were still very painful. LC assisted MOB with pumping/massaging for another 10 minutes. Once the 25 minutes were up, MOB verbalized some relief despite her breasts still being very hard. MOB was able to collect 2 ounces all together. LC then placed ice packs on MOB's breasts for 15 minutes. LC reviewed the signs and symptoms of mastitis and printed out information about this condition in Spanish for her.  LC encouraged MOB to continue following these steps when her breasts become engorged. LC also encouraged MOB to call [facility] for further assistance as needed. MOB became fatigued and decided to take a nap. RN was notified of MOB's condition.  Maternal Data Has patient been taught Hand Expression?: Yes Does the patient have breastfeeding experience prior to this delivery?: Yes How long did the patient breastfeed?: 6 months  Feeding Mother's Current Feeding Choice: Breast Milk and Formula Nipple Type: Slow - flow  LATCH Score Latch: Grasps breast easily, tongue down, lips flanged, rhythmical sucking.  Audible Swallowing: Spontaneous and intermittent  Type of Nipple: Everted at rest and after stimulation  Comfort (Breast/Nipple): Filling, red/small blisters or bruises,  mild/mod discomfort  Hold (Positioning): Assistance needed to correctly position infant at breast and maintain latch.  LATCH Score: 8   Lactation Tools Discussed/Used Tools: Pump;Flanges;8 oz. bottles (ice) Flange Size: 21;18 Breast pump type: Double-Electric Breast Pump;Manual Pump Education: Setup, frequency, and cleaning;Milk Storage Reason for Pumping: engorgement Pumping frequency: 15-20 min every 2-3 hrs Pumped volume: 60 mL  Interventions Interventions: Breast feeding basics reviewed;Breast massage;Expressed milk;DEBP;8 oz. bottles;Ice;Education;LC Services brochure  Discharge Discharge Education: Engorgement and breast care;Warning signs for feeding baby  Consult Status Consult Status: Complete Date: 04/11/23 Follow-up type: In-patient    Dema Severin BS, IBCLC 04/11/2023, 2:15 PM

## 2023-04-15 ENCOUNTER — Ambulatory Visit (INDEPENDENT_AMBULATORY_CARE_PROVIDER_SITE_OTHER): Payer: Self-pay | Admitting: Family Medicine

## 2023-04-15 ENCOUNTER — Encounter: Payer: Self-pay | Admitting: Family Medicine

## 2023-04-15 VITALS — BP 119/65 | HR 81 | Ht 64.0 in | Wt 192.2 lb

## 2023-04-15 DIAGNOSIS — Z98891 History of uterine scar from previous surgery: Secondary | ICD-10-CM

## 2023-04-15 NOTE — Patient Instructions (Signed)
Thank you for visiting clinic today - it is always our pleasure to care for you.  Today we checked in on your C section healing. Your incision appears to be healing well! You may use an abdominal binder and silicone patch as needed for comfort. If you notice irritation, please discontinue usage. Please try to leave incision area uncovered as you are able to for healing.   Please schedule an appointment in 6 weeks for birth control discussion if you are interested.   We are so happy for you and your growing family - congratulations again!  Reach out any time with any questions or concerns you may have - we are here for you!  Ivery Quale, MD [facility] [phone]

## 2023-04-15 NOTE — Progress Notes (Cosign Needed)
    SUBJECTIVE:   CHIEF COMPLAINT / HPI:   C section follow up Patient presenting today for follow up on recent C-section incision site from delivery on 10/17. Since then, patient has been feeling tired and 4-5/10 abdominal pain at times. No incision site bleeding or drainage. No recent fever. She was having some vaginal bleeding after the procedure but this bleeding stopped last night. She has had a normal appetite without N/V/D. Occasional mild constipation that resolves with miralax and colace, last normal BM on Tuesday. No CP, SOB, HA, or VC. She feels that her mood has been fine overall.  PERTINENT  PMH / PSH: C-section delivery on 04/08/23  OBJECTIVE:   BP (!) 110/59   Pulse 81   Ht 5\' 4"  (1.626 m)   Wt 192 lb 4 oz (87.2 kg)   LMP 07/05/2022 (Exact Date)   SpO2 100%   BMI 33.00 kg/m   General: Well-appearing. Resting comfortably in room. CV: Normal S1/S2. No extra heart sounds. Warm and well-perfused. Pulm: Breathing comfortably on room air. CTAB. No increased WOB. Abd: Soft, non-tender, non-distended. Skin:  Well-appearing C-section incision. No bleeding or drainage. No erythema or induration. No pain with palpation.  Psych: Pleasant and appropriate.      ASSESSMENT/PLAN:   C section follow up Patient's incision appears to be healing well at this time.  - Discussed abdominal binder and silicone strips as needed for comfort - Discussed leaving site uncovered when able for better healing    Patient to schedule appt in 6 weeks as needed for birth control discussion.  Ivery Quale, MD [facility]

## 2023-04-21 ENCOUNTER — Encounter: Payer: Self-pay | Admitting: Family Medicine

## 2023-04-22 ENCOUNTER — Telehealth: Payer: Self-pay | Admitting: Family Medicine

## 2023-04-22 NOTE — Telephone Encounter (Signed)
Called patient to check in on symptoms and any questions regarding most recent patient message. Unable to reach, left voicemail informing patient to call back or use MyChart for any questions or concerns.

## 2023-04-22 NOTE — Telephone Encounter (Signed)
Attempted to call patient to speak with her regarding her most recent MyChart messages about incision symptoms. Left patient a message letting her know that I would message her on MyChart, recommending her to see her OBGYN for any further concerns or assessment of the incision wound.

## 2023-06-23 NOTE — Progress Notes (Deleted)
  [facility] Postpartum Visit   Wendy Morrison is a 28 y.o. [id number] presenting for a postpartum visit.  She has the following concerns today: *** She delivered via repeat C-section at [redacted]w[redacted]d to healthy female infant She reports her vaginal bleeding is ***. She is *** feeding her infant. She feels she is bonding well. She is considering *** for contraception.   Edinburgh Postnatal Depression Scale: *** (10 or higher is positive)  Reviewed pregnancy and delivery course ***.  -  There were no vitals filed for this visit. Exam: *** Pelvic exam: ***   A/P:  Postpartum visit: patient is *** weeks postpartum following a *** delivery. -Discussed patients delivery*** and complications -Patient had a no vaginal laceration, perineal healing reviewed. Patient expressed understanding -Patient has urinary incontinence? {yes/no:20286}*** Patient was referred to pelvic floor PT  -Patient {ACTION; IS/IS WNU:78978602} safe to resume physical and sexual activity -Patient {DOES_DOES WNU:81435} want a pregnancy in the next year.  Desired family size is {NUMBER 1-10:22536} children.  -Reviewed forms of contraception in tiered fashion. Patient desired {PLAN CONTRACEPTION:313102} today.   -Return to sexual activity and contraception discussed as above.  -Discussed birth spacing of 18 months -Breastfeeding: {yes/no:20286}, provided support of decision and resources as indicated  -Mood: ***  -Discussed sleep and fatigue management and encouraged family/community support.  -Postpartum vaccines: *** -Need for postpartum diabetes screening:  No -Reviewed prior Pap and she is next due for Pap ***.  -Needs PP MMR* -Return in No follow-ups on file.

## 2023-06-24 ENCOUNTER — Encounter: Payer: Medicaid Other | Admitting: Family Medicine
